# Patient Record
Sex: Female | Born: 1960 | Race: Black or African American | Hispanic: No | Marital: Single | Smoking: Never smoker
Health system: Southern US, Community
[De-identification: ages and names within clinical notes are randomized; demographics above are authoritative.]

## PROBLEM LIST (undated history)

## (undated) DIAGNOSIS — O3410 Maternal care for benign tumor of corpus uteri, unspecified trimester: Secondary | ICD-10-CM

## (undated) DIAGNOSIS — I10 Essential (primary) hypertension: Secondary | ICD-10-CM

## (undated) DIAGNOSIS — Z9289 Personal history of other medical treatment: Secondary | ICD-10-CM

## (undated) DIAGNOSIS — J45909 Unspecified asthma, uncomplicated: Secondary | ICD-10-CM

## (undated) DIAGNOSIS — Z332 Encounter for elective termination of pregnancy: Secondary | ICD-10-CM

## (undated) DIAGNOSIS — D259 Leiomyoma of uterus, unspecified: Secondary | ICD-10-CM

## (undated) DIAGNOSIS — R011 Cardiac murmur, unspecified: Secondary | ICD-10-CM

## (undated) HISTORY — PX: WISDOM TOOTH EXTRACTION: SHX21

---

## 2015-10-25 ENCOUNTER — Encounter: Payer: Self-pay | Admitting: Family Medicine

## 2015-10-31 ENCOUNTER — Encounter: Payer: Self-pay | Admitting: Family Medicine

## 2015-11-12 ENCOUNTER — Encounter: Payer: Self-pay | Admitting: Family Medicine

## 2015-12-17 ENCOUNTER — Encounter: Payer: Self-pay | Admitting: Family Medicine

## 2016-01-08 ENCOUNTER — Encounter: Payer: Self-pay | Admitting: Family Medicine

## 2016-06-14 ENCOUNTER — Emergency Department (HOSPITAL_COMMUNITY): Payer: Self-pay

## 2016-06-14 ENCOUNTER — Encounter (HOSPITAL_COMMUNITY): Payer: Self-pay | Admitting: Emergency Medicine

## 2016-06-14 ENCOUNTER — Inpatient Hospital Stay (HOSPITAL_COMMUNITY)
Admission: EM | Admit: 2016-06-14 | Discharge: 2016-06-16 | DRG: 418 | Disposition: A | Discharge status: Home / Self Care | Payer: Self-pay | Attending: Surgery | Admitting: Surgery

## 2016-06-14 DIAGNOSIS — Z888 Allergy status to other drugs, medicaments and biological substances status: Secondary | ICD-10-CM

## 2016-06-14 DIAGNOSIS — K819 Cholecystitis, unspecified: Secondary | ICD-10-CM

## 2016-06-14 DIAGNOSIS — N1330 Unspecified hydronephrosis: Secondary | ICD-10-CM | POA: Diagnosis present

## 2016-06-14 DIAGNOSIS — Z6841 Body Mass Index (BMI) 40.0 and over, adult: Secondary | ICD-10-CM

## 2016-06-14 DIAGNOSIS — K8010 Calculus of gallbladder with chronic cholecystitis without obstruction: Principal | ICD-10-CM | POA: Diagnosis present

## 2016-06-14 DIAGNOSIS — R918 Other nonspecific abnormal finding of lung field: Secondary | ICD-10-CM | POA: Diagnosis present

## 2016-06-14 DIAGNOSIS — R1011 Right upper quadrant pain: Secondary | ICD-10-CM

## 2016-06-14 DIAGNOSIS — K760 Fatty (change of) liver, not elsewhere classified: Secondary | ICD-10-CM | POA: Diagnosis present

## 2016-06-14 DIAGNOSIS — J45909 Unspecified asthma, uncomplicated: Secondary | ICD-10-CM | POA: Diagnosis present

## 2016-06-14 DIAGNOSIS — D1771 Benign lipomatous neoplasm of kidney: Secondary | ICD-10-CM | POA: Diagnosis present

## 2016-06-14 DIAGNOSIS — I10 Essential (primary) hypertension: Secondary | ICD-10-CM | POA: Diagnosis present

## 2016-06-14 DIAGNOSIS — D3502 Benign neoplasm of left adrenal gland: Secondary | ICD-10-CM | POA: Diagnosis present

## 2016-06-14 HISTORY — DX: Unspecified asthma, uncomplicated: J45.909

## 2016-06-14 HISTORY — DX: Essential (primary) hypertension: I10

## 2016-06-14 HISTORY — DX: Cardiac murmur, unspecified: R01.1

## 2016-06-14 HISTORY — DX: Leiomyoma of uterus, unspecified: D25.9

## 2016-06-14 HISTORY — DX: Maternal care for benign tumor of corpus uteri, unspecified trimester: O34.10

## 2016-06-14 LAB — SURGICAL PCR SCREEN
MRSA, PCR: NEGATIVE
Staphylococcus aureus: NEGATIVE

## 2016-06-14 LAB — COMPREHENSIVE METABOLIC PANEL
ALBUMIN: 3.2 g/dL — AB (ref 3.5–5.0)
ALK PHOS: 67 U/L (ref 38–126)
ALT: 182 U/L — AB (ref 14–54)
AST: 78 U/L — AB (ref 15–41)
Anion gap: 12 (ref 5–15)
BILIRUBIN TOTAL: 0.7 mg/dL (ref 0.3–1.2)
BUN: 6 mg/dL (ref 6–20)
CALCIUM: 9.2 mg/dL (ref 8.9–10.3)
CO2: 21 mmol/L — ABNORMAL LOW (ref 22–32)
CREATININE: 0.62 mg/dL (ref 0.44–1.00)
Chloride: 104 mmol/L (ref 101–111)
GFR calc Af Amer: >60 mL/min (ref >60)
GLUCOSE: 112 mg/dL — AB (ref 65–99)
POTASSIUM: 4 mmol/L (ref 3.5–5.1)
Sodium: 137 mmol/L (ref 135–145)
TOTAL PROTEIN: 7.5 g/dL (ref 6.5–8.1)

## 2016-06-14 LAB — CBC WITH DIFFERENTIAL/PLATELET
BASOS ABS: 0 10*3/uL (ref 0.0–0.1)
Basophils Relative: 0 %
Eosinophils Absolute: 0 10*3/uL (ref 0.0–0.7)
Eosinophils Relative: 0 %
HEMATOCRIT: 43.6 % (ref 36.0–46.0)
HEMOGLOBIN: 14.7 g/dL (ref 12.0–15.0)
LYMPHS PCT: 15 %
Lymphs Abs: 1.2 10*3/uL (ref 0.7–4.0)
MCH: 32 pg (ref 26.0–34.0)
MCHC: 33.7 g/dL (ref 30.0–36.0)
MCV: 95 fL (ref 78.0–100.0)
MONO ABS: 0.7 10*3/uL (ref 0.1–1.0)
Monocytes Relative: 8 %
NEUTROS ABS: 6.2 10*3/uL (ref 1.7–7.7)
NEUTROS PCT: 77 %
Platelets: 367 10*3/uL (ref 150–400)
RBC: 4.59 MIL/uL (ref 3.87–5.11)
RDW: 14.1 % (ref 11.5–15.5)
WBC: 8.1 10*3/uL (ref 4.0–10.5)

## 2016-06-14 LAB — URINALYSIS, ROUTINE W REFLEX MICROSCOPIC
GLUCOSE, UA: 100 mg/dL — AB
KETONES UR: 15 mg/dL — AB
Leukocytes, UA: NEGATIVE
Nitrite: NEGATIVE
PH: 6.5 (ref 5.0–8.0)
Protein, ur: 100 mg/dL — AB

## 2016-06-14 LAB — URINE MICROSCOPIC-ADD ON

## 2016-06-14 LAB — LIPASE, BLOOD: LIPASE: 23 U/L (ref 11–51)

## 2016-06-14 LAB — TROPONIN I: Troponin I: <0.03 ng/mL (ref <0.03)

## 2016-06-14 MED ORDER — ONDANSETRON HCL 4 MG/2ML IJ SOLN: 4.0000 mg | Freq: Four times a day (QID) | INTRAMUSCULAR | Status: DC | PRN

## 2016-06-14 MED ORDER — OXYCODONE HCL 5 MG PO TABS
5.0000 mg | ORAL_TABLET | ORAL | Status: DC | PRN
Start: 2016-06-14 — End: 2016-06-16
  Administered 2016-06-16 (×3): 10 mg via ORAL
  Filled 2016-06-14 (×3): qty 2

## 2016-06-14 MED ORDER — PROMETHAZINE HCL 25 MG/ML IJ SOLN
12.5000 mg | Freq: Once | INTRAMUSCULAR | Status: AC
  Administered 2016-06-14: 12.5 mg via INTRAVENOUS
  Filled 2016-06-14: qty 1

## 2016-06-14 MED ORDER — ACETAMINOPHEN 650 MG RE SUPP: 650.0000 mg | Freq: Four times a day (QID) | RECTAL | Status: DC | PRN

## 2016-06-14 MED ORDER — DEXTROSE 5 % IV SOLN
2.0000 g | INTRAVENOUS | Status: DC
  Administered 2016-06-15 – 2016-06-16 (×2): 2 g via INTRAVENOUS
  Filled 2016-06-14 (×2): qty 2

## 2016-06-14 MED ORDER — FAMOTIDINE IN NACL 20-0.9 MG/50ML-% IV SOLN
20.0000 mg | Freq: Two times a day (BID) | INTRAVENOUS | Status: DC
  Administered 2016-06-14 – 2016-06-16 (×5): 20 mg via INTRAVENOUS
  Filled 2016-06-14 (×6): qty 50

## 2016-06-14 MED ORDER — ZOLPIDEM TARTRATE 5 MG PO TABS: 5.0000 mg | ORAL_TABLET | Freq: Every evening | ORAL | Status: DC | PRN

## 2016-06-14 MED ORDER — FENTANYL CITRATE (PF) 100 MCG/2ML IJ SOLN
50.0000 ug | Freq: Once | INTRAMUSCULAR | Status: AC
  Administered 2016-06-14: 50 ug via INTRAVENOUS
  Filled 2016-06-14: qty 2

## 2016-06-14 MED ORDER — KCL IN DEXTROSE-NACL 20-5-0.45 MEQ/L-%-% IV SOLN
INTRAVENOUS | Status: DC
  Administered 2016-06-14 – 2016-06-16 (×4): via INTRAVENOUS
  Filled 2016-06-14 (×6): qty 1000

## 2016-06-14 MED ORDER — PROMETHAZINE HCL 25 MG/ML IJ SOLN
12.5000 mg | Freq: Four times a day (QID) | INTRAMUSCULAR | Status: DC | PRN
  Administered 2016-06-14: 12.5 mg via INTRAVENOUS
  Filled 2016-06-14 (×2): qty 1

## 2016-06-14 MED ORDER — ONDANSETRON HCL 4 MG/2ML IJ SOLN
4.0000 mg | Freq: Once | INTRAMUSCULAR | Status: AC
  Administered 2016-06-14: 4 mg via INTRAVENOUS
  Filled 2016-06-14: qty 2

## 2016-06-14 MED ORDER — HYDROMORPHONE HCL 1 MG/ML IJ SOLN
0.5000 mg | INTRAMUSCULAR | Status: DC | PRN
  Administered 2016-06-14: 2 mg via INTRAVENOUS
  Administered 2016-06-14 (×2): 1 mg via INTRAVENOUS
  Administered 2016-06-15 (×3): 2 mg via INTRAVENOUS
  Administered 2016-06-15: 1 mg via INTRAVENOUS
  Administered 2016-06-15: 2 mg via INTRAVENOUS
  Filled 2016-06-14: qty 2
  Filled 2016-06-14: qty 1
  Filled 2016-06-14 (×3): qty 2
  Filled 2016-06-14 (×2): qty 1
  Filled 2016-06-14: qty 2

## 2016-06-14 MED ORDER — ONDANSETRON 4 MG PO TBDP: 4.0000 mg | ORAL_TABLET | Freq: Four times a day (QID) | ORAL | Status: DC | PRN

## 2016-06-14 MED ORDER — ACETAMINOPHEN 325 MG PO TABS: 650.0000 mg | ORAL_TABLET | Freq: Four times a day (QID) | ORAL | Status: DC | PRN

## 2016-06-14 MED ORDER — ALBUTEROL SULFATE (2.5 MG/3ML) 0.083% IN NEBU: 2.5000 mg | INHALATION_SOLUTION | RESPIRATORY_TRACT | Status: DC | PRN

## 2016-06-14 MED ORDER — HYDROCHLOROTHIAZIDE 25 MG PO TABS
12.5000 mg | ORAL_TABLET | Freq: Every day | ORAL | Status: DC
  Administered 2016-06-14 – 2016-06-16 (×2): 12.5 mg via ORAL
  Filled 2016-06-14: qty 1

## 2016-06-14 MED ORDER — NORETHINDRONE ACETATE 5 MG PO TABS
10.0000 mg | ORAL_TABLET | Freq: Every day | ORAL | Status: DC
  Administered 2016-06-14: 10 mg via ORAL

## 2016-06-14 MED ORDER — ENOXAPARIN SODIUM 40 MG/0.4ML ~~LOC~~ SOLN: 40.0000 mg | Freq: Once | SUBCUTANEOUS | Status: DC

## 2016-06-14 MED ORDER — HYDROMORPHONE HCL 1 MG/ML IJ SOLN
1.0000 mg | INTRAMUSCULAR | Status: DC | PRN
  Administered 2016-06-14: 1 mg via INTRAVENOUS
  Filled 2016-06-14: qty 1

## 2016-06-14 MED ORDER — HYDRALAZINE HCL 20 MG/ML IJ SOLN: 10.0000 mg | INTRAMUSCULAR | Status: DC | PRN

## 2016-06-14 MED ORDER — DEXTROSE 5 % IV SOLN
2.0000 g | Freq: Once | INTRAVENOUS | Status: AC
  Administered 2016-06-14: 2 g via INTRAVENOUS
  Filled 2016-06-14: qty 2

## 2016-06-14 MED ORDER — ONDANSETRON HCL 4 MG/2ML IJ SOLN
2.0000 mg | Freq: Four times a day (QID) | INTRAMUSCULAR | Status: DC | PRN
  Administered 2016-06-14: 2 mg via INTRAVENOUS
  Filled 2016-06-14: qty 2

## 2016-06-14 MED ORDER — METHOCARBAMOL 500 MG PO TABS
500.0000 mg | ORAL_TABLET | Freq: Four times a day (QID) | ORAL | Status: DC | PRN
  Administered 2016-06-16: 500 mg via ORAL
  Filled 2016-06-14: qty 1

## 2016-06-14 MED ORDER — SIMETHICONE 80 MG PO CHEW
40.0000 mg | CHEWABLE_TABLET | Freq: Four times a day (QID) | ORAL | Status: DC | PRN
  Administered 2016-06-14: 40 mg via ORAL
  Filled 2016-06-14: qty 1

## 2016-06-14 NOTE — ED Notes (Signed)
Pt and family made aware of bed assignment 

## 2016-06-14 NOTE — ED Notes (Signed)
Patient transported to X-ray 

## 2016-06-14 NOTE — ED Triage Notes (Signed)
Pt arrives from home c/o epigastric pain and CP that started as pain behind R shoulder blade.  Pt states pain woke her up, reports nausea, denies LOC, vomiting, diarrhea, SOB.

## 2016-06-14 NOTE — ED Notes (Signed)
Patient transported to CT 
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
## 2016-06-14 NOTE — ED Notes (Signed)
Attempted report to 6N 

## 2016-06-14 NOTE — ED Notes (Signed)
Pt is actively vomiting Dr. Wyvonnia Dusky informed and this RN given verbal order for phenergan and fentanyl.

## 2016-06-14 NOTE — ED Notes (Signed)
MD at bedside. 

## 2016-06-14 NOTE — ED Provider Notes (Signed)
[facility] DEPT Provider Note   CSN: [id number] Arrival date & time: 06/14/16  [id number]     History   Chief Complaint Chief Complaint  Patient presents with  . Abdominal Pain  . Chest Pain    HPI Laurie Peters is a 55 y.o. female.  Patient presents with epigastric and right upper quadrant pain that started this morning around 2 AM. Pain woke her from sleep actually that was in her posterior right shoulder has since moved to her upper abdomen and radiates into her chest. Associated with nausea and dry heaving but no vomiting. She has not had this pain in the past. No fever. She said chills. Denies any cardiac history. Denies any dysuria hematuria. Denies any vaginal bleeding or discharge. She still has a gallbladder. She has no change in bowel habits.   The history is provided by the patient. The history is limited by the condition of the patient.  Abdominal Pain   Associated symptoms include nausea. Pertinent negatives include fever, vomiting, dysuria, hematuria, headaches, arthralgias and myalgias.  Chest Pain   Associated symptoms include abdominal pain and nausea. Pertinent negatives include no cough, no dizziness, no fever, no headaches, no shortness of breath and no vomiting.    Past Medical History:  Diagnosis Date  . Asthma   . Heart murmur   . Hypertension   . Uterine fibroids affecting pregnancy, antepartum     There are no active problems to display for this patient.   History reviewed. No pertinent surgical history.  OB History    No data available       Home Medications    Prior to Admission medications   Not on File    Family History No family history on file.  Social History Social History  Substance Use Topics  . Smoking status: Never Smoker  . Smokeless tobacco: Never Used  . Alcohol use Yes     Comment: "occassionally"     Allergies   Theraflu cold & [phenylephrine-pheniramine-dm]   Review of Systems Review of Systems    Constitutional: Positive for activity change, appetite change and chills. Negative for fever.  HENT: Negative for congestion and rhinorrhea.   Eyes: Negative for visual disturbance.  Respiratory: Positive for chest tightness. Negative for cough and shortness of breath.   Cardiovascular: Positive for chest pain. Negative for leg swelling.  Gastrointestinal: Positive for abdominal pain and nausea. Negative for vomiting.  Genitourinary: Negative for dysuria, hematuria, vaginal bleeding and vaginal discharge.  Musculoskeletal: Negative for arthralgias and myalgias.  Skin: Negative for wound.  Neurological: Negative for dizziness, light-headedness and headaches.  A complete 10 system review of systems was obtained and all systems are negative except as noted in the HPI and PMH.     Physical Exam Updated Vital Signs BP 151/82 (BP Location: Right Arm)   Pulse 72   Temp 97.7 F (36.5 C) (Oral)   Resp 17   SpO2 100%   Physical Exam  Constitutional: She is oriented to person, place, and time. She appears well-developed and well-nourished. She appears distressed.  uncomfortable  HENT:  Head: Normocephalic and atraumatic.  Mouth/Throat: Oropharynx is clear and moist. No oropharyngeal exudate.  Eyes: Conjunctivae and EOM are normal. Pupils are equal, round, and reactive to light.  Neck: Normal range of motion. Neck supple.  No meningismus.  Cardiovascular: Normal rate, regular rhythm, normal heart sounds and intact distal pulses.   No murmur heard. Pulmonary/Chest: Effort normal and breath sounds normal. No respiratory distress. She exhibits  no tenderness.  Abdominal: Soft. There is tenderness. There is guarding. There is no rebound.  TTP epigastric and RUQ with guarding  Musculoskeletal: Normal range of motion. She exhibits no edema or tenderness.  Neurological: She is alert and oriented to person, place, and time. No cranial nerve deficit. She exhibits normal muscle tone. Coordination  normal.   5/5 strength throughout. CN 2-12 intact.Equal grip strength.   Skin: Skin is warm.  Psychiatric: She has a normal mood and affect. Her behavior is normal.  Nursing note and vitals reviewed.    ED Treatments / Results  Labs (all labs ordered are listed, but only abnormal results are displayed) Labs Reviewed  COMPREHENSIVE METABOLIC PANEL - Abnormal; Notable for the following:       Result Value   CO2 21 (*)    Glucose, Bld 112 (*)    Albumin 3.2 (*)    AST 78 (*)    ALT 182 (*)    All other components within normal limits  URINALYSIS, ROUTINE W REFLEX MICROSCOPIC (NOT AT [facility]) - Abnormal; Notable for the following:    Specific Gravity, Urine >1.030 (*)    Glucose, UA 100 (*)    Hgb urine dipstick MODERATE (*)    Bilirubin Urine SMALL (*)    Ketones, ur 15 (*)    Protein, ur 100 (*)    All other components within normal limits  URINE MICROSCOPIC-ADD ON - Abnormal; Notable for the following:    Squamous Epithelial / LPF 0-5 (*)    Bacteria, UA FEW (*)    All other components within normal limits  SURGICAL PCR SCREEN  CBC WITH DIFFERENTIAL/PLATELET  LIPASE, BLOOD  TROPONIN I  COMPREHENSIVE METABOLIC PANEL  CBC    EKG  EKG Interpretation  Date/Time:  Saturday June 14 2016 07:06:42 EDT Ventricular Rate:  62 PR Interval:    QRS Duration: 95 QT Interval:  388 QTC Calculation: 394 R Axis:   32 Text Interpretation:  Sinus rhythm ST elev, probable normal early repol pattern No previous ECGs available Confirmed by Wyvonnia Dusky  MD, Calhoun Reichardt [phone]) on 06/14/2016 7:23:24 AM       Radiology Dg Chest 2 View  Result Date: 06/14/2016 CLINICAL DATA:  Pt c/o upper abdominal pain, lower back pain, n/v, with fever since 2 am. Pt rates pain an 8 on a 1-10 scale. Hx asthma, HTN, non-smoker. EXAM: CHEST  2 VIEW COMPARISON:  None. FINDINGS: The heart size and mediastinal contours are within normal limits. Both lungs are clear. No pleural effusion or pneumothorax. The  visualized skeletal structures are unremarkable. IMPRESSION: No active cardiopulmonary disease. Electronically Signed   By: Lajean Manes M.D.   On: 06/14/2016 09:16   Ct Renal Stone Study  Addendum Date: 06/14/2016   ADDENDUM REPORT: 06/14/2016 11:46 ADDENDUM: The right middle lobe lung nodules are not visible on the chest radiographs obtained earlier today. Electronically Signed   By: Claudie Revering M.D.   On: 06/14/2016 11:46   Result Date: 06/14/2016 CLINICAL DATA:  Right the upper quadrant abdominal pain, nausea and vomiting since 2 a.m. today. Right hydronephrosis at ultrasound earlier today. EXAM: CT ABDOMEN AND PELVIS WITHOUT CONTRAST TECHNIQUE: Multidetector CT imaging of the abdomen and pelvis was performed following the standard protocol without IV contrast. COMPARISON:  Right upper quadrant abdomen ultrasound obtained earlier today. FINDINGS: Lower chest: Minimal bilateral dependent atelectasis. 8 x 4 mm oval nodular density in the lateral aspect of the right middle lobe on the first image. Two adjacent 5  mm nodular densities in the lateral aspect of the right lower lobe on image 5. Minimal bilateral lower lobe dependent atelectasis. Hepatobiliary: Multiple gallstones in the gallbladder. No gallbladder wall thickening or pericholecystic fluid. Normal appearing liver. Pancreas: Unremarkable. No pancreatic ductal dilatation or surrounding inflammatory changes. Spleen: Normal in size without focal abnormality. Adrenals/Urinary Tract: Two low-density left adrenal masses. One measures 1.7 x 1.5 cm and -4 Hounsfield units on image number 24 and the other measures 2.3 x 1.2 cm and -13 Hounsfield units on image number 20 of series 2. Normal appearing right adrenal gland. Rounded, predominantly fat density mass in the anterior aspect of the upper pole of the right kidney, measuring 2.2 x 1.8 cm on image number 28 of series 2. Multiple bilateral parapelvic renal cysts. No hydronephrosis seen. Normal appearing  ureters. Poorly distended urinary bladder with no visible abnormality. The Stomach/Bowel: Stomach is within normal limits. Appendix appears normal. No evidence of bowel wall thickening, distention, or inflammatory changes. Vascular/Lymphatic: No significant vascular findings are present. No enlarged abdominal or pelvic lymph nodes. Reproductive: Enlarged uterus containing multiple poorly defined masses. Known uterine fibroids by history. No adnexal masses. Other: No free peritoneal fluid. Musculoskeletal: Mild bilateral hip degenerative changes. Mild lumbar and lower thoracic spine degenerative changes. IMPRESSION: 1. No acute abnormality. 2. Cholelithiasis. 3. 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 5. Bilateral renal parapelvic cysts without hydronephrosis. 6. Three right middle lobe nodules. The largest has an average diameter of 6 mm and is not included in its entirety. Non-contrast chest CT at 6-12 months is recommended. If the nodule is stable at time of repeat CT, then future CT at 18-24 months (from today's scan) is considered optional for low-risk patients, but is recommended for high-risk patients. This recommendation follows the consensus statement: Guidelines for Management of Incidental Pulmonary Nodules Detected on CT Images: From the Fleischner Society 2017; Radiology 2017; 284:228-243. Electronically Signed: By: Claudie Revering M.D. On: 06/14/2016 11:42   US Abdomen Limited Ruq  Result Date: 06/14/2016 CLINICAL DATA:  Right upper quadrant pain since 20 a.m. EXAM: US ABDOMEN LIMITED - RIGHT UPPER QUADRANT COMPARISON:  None. FINDINGS: Gallbladder: Dependent sludge and small stones, largest measuring 9 mm. No wall thickening. No pericholecystic fluid. Common bile duct: Diameter: 5 mm Liver: Mild increased parenchymal echogenicity suggesting fatty infiltration. No mass or focal lesion. Normal liver size. Normal hepatopetal flow in the portal vein. There is mild to moderate right  hydronephrosis. IMPRESSION: 1. No evidence of acute cholecystitis. 2. Small gallstones and gallbladder sludge. 3. Mild increased echogenicity of the liver suggesting mild hepatic steatosis. 4. Right hydronephrosis. Patient may have obstructive uropathy on the right. Consider follow-up unenhanced CT of the abdomen pelvis for further assessment. Electronically Signed   By: Lajean Manes M.D.   On: 06/14/2016 09:18    Procedures Procedures (including critical care time)  Medications Ordered in ED Medications  ondansetron (ZOFRAN) injection 4 mg (not administered)  fentaNYL (SUBLIMAZE) injection 50 mcg (not administered)     Initial Impression / Assessment and Plan / ED Course  I have reviewed the triage vital signs and the nursing notes.  Pertinent labs & imaging results that were available during my care of the patient were reviewed by me and considered in my medical decision making (see chart for details).  Clinical Course  Epigastric and right upper quadrant pain A shoulder associated with nausea. EKG shows J-point elevation consistent with early repolarization. No reciprocal change, no comparison.  Presentation appears consistent with  gallbladder pathology. Doubt ACS. LFTs mildly elevated.  Lipase normal, troponin normal.  US shows cholelithiasis. CT does not confirm hydronephrosis.  Patient informed of lung nodules.  D/w Surgery who will admit for symptomatic cholelithiasis.  Rocephin given.  Final Clinical Impressions(s) / ED Diagnoses   Final diagnoses:  RUQ pain  Cholecystitis    New Prescriptions New Prescriptions   No medications on file     Ezequiel Essex, MD 06/14/16 1752

## 2016-06-15 ENCOUNTER — Encounter (HOSPITAL_COMMUNITY): Admission: EM | Disposition: A | Discharge status: Home / Self Care | Payer: Self-pay | Source: Home / Self Care

## 2016-06-15 ENCOUNTER — Inpatient Hospital Stay (HOSPITAL_COMMUNITY): Payer: Self-pay | Admitting: Anesthesiology

## 2016-06-15 ENCOUNTER — Inpatient Hospital Stay (HOSPITAL_COMMUNITY): Payer: Self-pay

## 2016-06-15 HISTORY — PX: CHOLECYSTECTOMY: SHX55

## 2016-06-15 LAB — COMPREHENSIVE METABOLIC PANEL
ALT: 232 U/L — ABNORMAL HIGH (ref 14–54)
ANION GAP: 5 (ref 5–15)
AST: 128 U/L — ABNORMAL HIGH (ref 15–41)
Albumin: 2.9 g/dL — ABNORMAL LOW (ref 3.5–5.0)
Alkaline Phosphatase: 55 U/L (ref 38–126)
BUN: <5 mg/dL — ABNORMAL LOW (ref 6–20)
CALCIUM: 8.3 mg/dL — AB (ref 8.9–10.3)
CHLORIDE: 105 mmol/L (ref 101–111)
CO2: 24 mmol/L (ref 22–32)
Creatinine, Ser: 0.71 mg/dL (ref 0.44–1.00)
GFR calc non Af Amer: >60 mL/min (ref >60)
Glucose, Bld: 117 mg/dL — ABNORMAL HIGH (ref 65–99)
POTASSIUM: 3.7 mmol/L (ref 3.5–5.1)
SODIUM: 134 mmol/L — AB (ref 135–145)
Total Bilirubin: 0.8 mg/dL (ref 0.3–1.2)
Total Protein: 6.5 g/dL (ref 6.5–8.1)

## 2016-06-15 LAB — CBC
HEMATOCRIT: 43.3 % (ref 36.0–46.0)
HEMOGLOBIN: 14 g/dL (ref 12.0–15.0)
MCH: 31 pg (ref 26.0–34.0)
MCHC: 32.3 g/dL (ref 30.0–36.0)
MCV: 95.8 fL (ref 78.0–100.0)
Platelets: 392 10*3/uL (ref 150–400)
RBC: 4.52 MIL/uL (ref 3.87–5.11)
RDW: 14.3 % (ref 11.5–15.5)
WBC: 12.5 10*3/uL — ABNORMAL HIGH (ref 4.0–10.5)

## 2016-06-15 SURGERY — LAPAROSCOPIC CHOLECYSTECTOMY
Anesthesia: General | Site: Abdomen

## 2016-06-15 MED ORDER — ROCURONIUM BROMIDE 10 MG/ML (PF) SYRINGE
PREFILLED_SYRINGE | INTRAVENOUS | Status: AC
  Filled 2016-06-15: qty 10

## 2016-06-15 MED ORDER — IOPAMIDOL (ISOVUE-300) INJECTION 61%
INTRAVENOUS | Status: AC
  Filled 2016-06-15: qty 50

## 2016-06-15 MED ORDER — ROCURONIUM BROMIDE 10 MG/ML (PF) SYRINGE
PREFILLED_SYRINGE | INTRAVENOUS | Status: DC | PRN
  Administered 2016-06-15 (×2): 20 mg via INTRAVENOUS

## 2016-06-15 MED ORDER — ONDANSETRON HCL 4 MG/2ML IJ SOLN
INTRAMUSCULAR | Status: DC | PRN
  Administered 2016-06-15: 4 mg via INTRAVENOUS

## 2016-06-15 MED ORDER — ONDANSETRON HCL 4 MG/2ML IJ SOLN
INTRAMUSCULAR | Status: AC
  Filled 2016-06-15: qty 2

## 2016-06-15 MED ORDER — LIDOCAINE 2% (20 MG/ML) 5 ML SYRINGE
INTRAMUSCULAR | Status: AC
  Filled 2016-06-15: qty 5

## 2016-06-15 MED ORDER — HYDROMORPHONE HCL 1 MG/ML IJ SOLN
INTRAMUSCULAR | Status: AC
  Administered 2016-06-15: 0.5 mg via INTRAVENOUS
  Filled 2016-06-15: qty 1

## 2016-06-15 MED ORDER — SODIUM CHLORIDE 0.9 % IV SOLN
INTRAVENOUS | Status: DC | PRN
  Administered 2016-06-15: 11:00:00 via INTRAVENOUS

## 2016-06-15 MED ORDER — KETOROLAC TROMETHAMINE 30 MG/ML IJ SOLN
INTRAMUSCULAR | Status: AC
  Filled 2016-06-15: qty 1

## 2016-06-15 MED ORDER — FENTANYL CITRATE (PF) 100 MCG/2ML IJ SOLN
INTRAMUSCULAR | Status: DC | PRN
  Administered 2016-06-15 (×2): 100 ug via INTRAVENOUS

## 2016-06-15 MED ORDER — NORETHINDRONE ACETATE 5 MG PO TABS
10.0000 mg | ORAL_TABLET | ORAL | Status: DC
  Administered 2016-06-16: 10 mg via ORAL
  Filled 2016-06-15: qty 2

## 2016-06-15 MED ORDER — LIDOCAINE 2% (20 MG/ML) 5 ML SYRINGE
INTRAMUSCULAR | Status: DC | PRN
  Administered 2016-06-15: 50 mg via INTRAVENOUS

## 2016-06-15 MED ORDER — SODIUM CHLORIDE 0.9 % IV SOLN: INTRAVENOUS | Status: DC | PRN

## 2016-06-15 MED ORDER — BUPIVACAINE-EPINEPHRINE 0.25% -1:200000 IJ SOLN
INTRAMUSCULAR | Status: DC | PRN
  Administered 2016-06-15: 20 mL

## 2016-06-15 MED ORDER — FENTANYL CITRATE (PF) 100 MCG/2ML IJ SOLN
INTRAMUSCULAR | Status: AC
  Filled 2016-06-15: qty 2

## 2016-06-15 MED ORDER — HYDROCODONE-ACETAMINOPHEN 7.5-325 MG PO TABS: 1.0000 | ORAL_TABLET | Freq: Once | ORAL | Status: DC | PRN

## 2016-06-15 MED ORDER — 0.9 % SODIUM CHLORIDE (POUR BTL) OPTIME
TOPICAL | Status: DC | PRN
  Administered 2016-06-15: 1000 mL

## 2016-06-15 MED ORDER — PROPOFOL 10 MG/ML IV BOLUS
INTRAVENOUS | Status: AC
  Filled 2016-06-15: qty 20

## 2016-06-15 MED ORDER — MIDAZOLAM HCL 2 MG/2ML IJ SOLN
INTRAMUSCULAR | Status: AC
  Filled 2016-06-15: qty 2

## 2016-06-15 MED ORDER — SUCCINYLCHOLINE CHLORIDE 200 MG/10ML IV SOSY
PREFILLED_SYRINGE | INTRAVENOUS | Status: DC | PRN
  Administered 2016-06-15: 120 mg via INTRAVENOUS

## 2016-06-15 MED ORDER — FENTANYL CITRATE (PF) 100 MCG/2ML IJ SOLN
INTRAMUSCULAR | Status: AC
Start: 2016-06-15 — End: 2016-06-15
  Filled 2016-06-15: qty 2

## 2016-06-15 MED ORDER — PROPOFOL 10 MG/ML IV BOLUS
INTRAVENOUS | Status: DC | PRN
  Administered 2016-06-15: 200 mg via INTRAVENOUS

## 2016-06-15 MED ORDER — SUGAMMADEX SODIUM 500 MG/5ML IV SOLN
INTRAVENOUS | Status: AC
  Filled 2016-06-15: qty 5

## 2016-06-15 MED ORDER — SUCCINYLCHOLINE CHLORIDE 200 MG/10ML IV SOSY
PREFILLED_SYRINGE | INTRAVENOUS | Status: AC
  Filled 2016-06-15: qty 10

## 2016-06-15 MED ORDER — BUPIVACAINE-EPINEPHRINE (PF) 0.25% -1:200000 IJ SOLN
INTRAMUSCULAR | Status: AC
  Filled 2016-06-15: qty 30

## 2016-06-15 MED ORDER — HYDROMORPHONE HCL 1 MG/ML IJ SOLN
0.2500 mg | INTRAMUSCULAR | Status: DC | PRN
  Administered 2016-06-15 (×4): 0.5 mg via INTRAVENOUS

## 2016-06-15 MED ORDER — DEXAMETHASONE SODIUM PHOSPHATE 10 MG/ML IJ SOLN
INTRAMUSCULAR | Status: DC | PRN
Start: 2016-06-15 — End: 2016-06-15
  Administered 2016-06-15: 10 mg via INTRAVENOUS

## 2016-06-15 MED ORDER — KETOROLAC TROMETHAMINE 30 MG/ML IJ SOLN
30.0000 mg | Freq: Once | INTRAMUSCULAR | Status: AC | PRN
Start: 2016-06-15 — End: 2016-06-15
  Administered 2016-06-15: 30 mg via INTRAVENOUS

## 2016-06-15 MED ORDER — PROMETHAZINE HCL 25 MG/ML IJ SOLN: 6.2500 mg | INTRAMUSCULAR | Status: DC | PRN

## 2016-06-15 MED ORDER — SODIUM CHLORIDE 0.9 % IR SOLN
Status: DC | PRN
  Administered 2016-06-15 (×2): 1000 mL

## 2016-06-15 MED ORDER — DEXAMETHASONE SODIUM PHOSPHATE 10 MG/ML IJ SOLN
INTRAMUSCULAR | Status: AC
  Filled 2016-06-15: qty 1

## 2016-06-15 MED ORDER — SUGAMMADEX SODIUM 500 MG/5ML IV SOLN
INTRAVENOUS | Status: DC | PRN
  Administered 2016-06-15: 500 mg via INTRAVENOUS

## 2016-06-15 SURGICAL SUPPLY — 38 items
APPLIER CLIP 5 13 M/L LIGAMAX5 (MISCELLANEOUS) ×4
BLADE SURG CLIPPER 3M 9600 (MISCELLANEOUS) IMPLANT
CANISTER SUCTION 2500CC (MISCELLANEOUS) ×4 IMPLANT
CHLORAPREP W/TINT 26ML (MISCELLANEOUS) ×4 IMPLANT
CLIP APPLIE 5 13 M/L LIGAMAX5 (MISCELLANEOUS) ×2 IMPLANT
COVER MAYO STAND STRL (DRAPES) ×4 IMPLANT
COVER SURGICAL LIGHT HANDLE (MISCELLANEOUS) ×4 IMPLANT
DRAPE C-ARM 42X72 X-RAY (DRAPES) ×4 IMPLANT
ELECT REM PT RETURN 9FT ADLT (ELECTROSURGICAL) ×4
ELECTRODE REM PT RTRN 9FT ADLT (ELECTROSURGICAL) ×2 IMPLANT
GLOVE BIO SURGEON STRL SZ8 (GLOVE) ×4 IMPLANT
GLOVE BIOGEL PI IND STRL 8 (GLOVE) ×2 IMPLANT
GLOVE BIOGEL PI INDICATOR 8 (GLOVE) ×2
GLOVE ECLIPSE 8.0 STRL XLNG CF (GLOVE) ×4 IMPLANT
GLOVE SURG SIGNA 7.5 PF LTX (GLOVE) ×4 IMPLANT
GOWN STRL REUS W/ TWL LRG LVL3 (GOWN DISPOSABLE) ×4 IMPLANT
GOWN STRL REUS W/ TWL XL LVL3 (GOWN DISPOSABLE) ×4 IMPLANT
GOWN STRL REUS W/TWL LRG LVL3 (GOWN DISPOSABLE) ×4
GOWN STRL REUS W/TWL XL LVL3 (GOWN DISPOSABLE) ×4
HEMOSTAT SNOW SURGICEL 2X4 (HEMOSTASIS) ×4 IMPLANT
KIT BASIN OR (CUSTOM PROCEDURE TRAY) ×4 IMPLANT
KIT ROOM TURNOVER OR (KITS) ×4 IMPLANT
LIQUID BAND (GAUZE/BANDAGES/DRESSINGS) ×4 IMPLANT
NS IRRIG 1000ML POUR BTL (IV SOLUTION) ×4 IMPLANT
PAD ARMBOARD 7.5X6 YLW CONV (MISCELLANEOUS) ×4 IMPLANT
POUCH SPECIMEN RETRIEVAL 10MM (ENDOMECHANICALS) ×4 IMPLANT
SCISSORS LAP 5X35 DISP (ENDOMECHANICALS) ×4 IMPLANT
SET CHOLANGIOGRAPH 5 50 .035 (SET/KITS/TRAYS/PACK) ×4 IMPLANT
SET IRRIG TUBING LAPAROSCOPIC (IRRIGATION / IRRIGATOR) ×4 IMPLANT
SLEEVE ENDOPATH XCEL 5M (ENDOMECHANICALS) ×8 IMPLANT
SPECIMEN JAR SMALL (MISCELLANEOUS) ×4 IMPLANT
SUT MON AB 4-0 PC3 18 (SUTURE) ×4 IMPLANT
TOWEL OR 17X24 6PK STRL BLUE (TOWEL DISPOSABLE) ×4 IMPLANT
TOWEL OR 17X26 10 PK STRL BLUE (TOWEL DISPOSABLE) ×4 IMPLANT
TRAY LAPAROSCOPIC MC (CUSTOM PROCEDURE TRAY) ×4 IMPLANT
TROCAR XCEL BLUNT TIP 100MML (ENDOMECHANICALS) ×4 IMPLANT
TROCAR XCEL NON-BLD 5MMX100MML (ENDOMECHANICALS) ×4 IMPLANT
TUBING INSUFFLATION (TUBING) ×4 IMPLANT

## 2016-06-15 NOTE — Anesthesia Postprocedure Evaluation (Signed)
Anesthesia Post Note  Patient: Laurie Peters  Procedure(s) Performed: Procedure(s) (LRB): LAPAROSCOPIC CHOLECYSTECTOMY (N/A)  Patient location during evaluation: PACU Anesthesia Type: General Level of consciousness: awake and alert Pain management: pain level controlled Vital Signs Assessment: post-procedure vital signs reviewed and stable Respiratory status: spontaneous breathing, nonlabored ventilation, respiratory function stable and patient connected to nasal cannula oxygen Cardiovascular status: blood pressure returned to baseline and stable Postop Assessment: no signs of nausea or vomiting Anesthetic complications: no    Last Vitals:  Vitals:   06/15/16 1330 06/15/16 1403  BP: 131/73 116/69  Pulse:  72  Resp:  17  Temp:  36.9 C    Last Pain:  Vitals:   06/15/16 1403  TempSrc: Oral  PainSc:                  [name]

## 2016-06-15 NOTE — Op Note (Signed)
Laurie Peters, Laurie Peters NO.:  [id number]  MEDICAL RECORD NO.:  [id number]  LOCATION:  [id number]                        FACILITY:  [facility]  PHYSICIAN:  Coralie Keens, M.D. DATE OF BIRTH:  [birth year]  DATE OF PROCEDURE:  06/15/2016 DATE OF DISCHARGE:                              OPERATIVE REPORT   PREOPERATIVE DIAGNOSES:  Acute cholecystitis with cholelithiasis.  POSTOPERATIVE DIAGNOSES:  Acute cholecystitis with cholelithiasis.  PROCEDURE:  Laparoscopic cholecystectomy.  SURGEON:  Coralie Keens, M.D.  ASSISTANT:  Adin Hector, M.D.  ANESTHESIA:  General endotracheal anesthesia.  ESTIMATED BLOOD LOSS:  Minimal.  FINDINGS:  The patient was found to have acutely inflamed gallbladder with gangrenous changes and gallstones.  PROCEDURE IN DETAIL:  The patient was brought to the operating room, identified as [employer]  She was placed supine on the operating room table and general anesthesia was induced.  Her abdomen was then prepped and draped in the usual sterile fashion.  I made a small vertical incision above the umbilicus.  I took this down to the fascia which was then opened with a scalpel.  A hemostat was then used to pass through the peritoneal cavity under direct vision.  A 0 Vicryl pursestring suture was then placed around the fascial opening.  The Hasson port was placed through the opening and insufflation of the abdomen was begun.  I placed a 5-mm port in the patient's epigastrium and 2 more in the right upper quadrant all under direct vision.  The patient's gallbladder was found to be acutely inflamed with gangrenous changes.  I aspirated bile in order to grasp it and retract it above the liver.  Once I was able to aspirate, I could retract the gallbladder above the fatty liver.  It was very friable.  The tissue appeared to be tearing easily.  At this point, because of this, I decided to forego cholangiogram.  I was able to dissect it out the  cystic duct and achieve a critical window around it. It was then clipped three times proximally, once distally, and transected.  The cystic artery was then identified, clipped proximally and distally, and transected as well.  The gallbladder was then slowly dissected free from the liver bed with the electrocautery.  Once it was free from the liver bed, hemostasis appeared to be achieved with cautery as well as a piece of surgical snow.  I then placed the gallbladder in an endosac.  Several small stones were placed in the endosac as well and the gallbladder was removed through the umbilicus.  We then copiously irrigated the abdomen with normal saline.  All stones appeared to be removed.  Hemostasis also appeared to be achieved.  All ports were removed under direct vision and the abdomen was deflated.  With 0 Vicryl the umbilicus was tied in place closing the fascial defect.  All other incisions were anesthetized with Marcaine and closed with 4-0 Monocryl subcuticular sutures.  Skin glue was then applied.  The patient tolerated the procedure well.  All the counts were correct at the end of the procedure.  The patient was then extubated in the operating room and taken in stable condition to recovery room.  Coralie Keens, M.D.     DB/MEDQ  D:  06/15/2016  T:  06/15/2016  Job:  [id number]

## 2016-06-15 NOTE — Transfer of Care (Signed)
Immediate Anesthesia Transfer of Care Note  Patient: Laurie Peters  Procedure(s) Performed: Procedure(s): LAPAROSCOPIC CHOLECYSTECTOMY (N/A)  Patient Location: PACU  Anesthesia Type:General  Level of Consciousness: awake  Airway & Oxygen Therapy: Patient Spontanous Breathing and Patient connected to nasal cannula oxygen  Post-op Assessment: Report given to RN and Post -op Vital signs reviewed and stable  Post vital signs: Reviewed and stable  Last Vitals:  Vitals:   06/15/16 0556 06/15/16 1019  BP: (!) 128/57 140/81  Pulse: 72 95  Resp: 18 19  Temp: 36.9 C 37 C    Last Pain:  Vitals:   06/15/16 1019  TempSrc: Oral  PainSc:       Patients Stated Pain Goal: 3 ([id number])  Complications: No apparent anesthesia complications

## 2016-06-15 NOTE — Progress Notes (Signed)
Patient ID: Laurie Peters, female   DOB: 05/19/1961, 55 y.o.   MRN: [id number]  Patient with symptomatic cholelithiasis. Plan lap chole today I discussed the procedure in detail.  We discussed the risks and benefits of a laparoscopic cholecystectomy and cholangiogram including, but not limited to bleeding, infection, injury to surrounding structures such as the intestine or liver, bile leak, retained gallstones, need to convert to an open procedure, prolonged diarrhea, blood clots such as  DVT, common bile duct injury, anesthesia risks, and possible need for additional procedures.  The likelihood of improvement in symptoms and return to the patient's normal status is good. We discussed the typical post-operative recovery course.

## 2016-06-15 NOTE — Anesthesia Procedure Notes (Signed)
Procedure Name: Intubation Date/Time: 06/15/2016 11:34 AM Performed by: Marinda Elk A Pre-anesthesia Checklist: Patient identified, Emergency Drugs available, Suction available, Patient being monitored and Timeout performed Patient Re-evaluated:Patient Re-evaluated prior to inductionOxygen Delivery Method: Circle System Utilized and Circle system utilized Preoxygenation: Pre-oxygenation with 100% oxygen Intubation Type: IV induction Ventilation: Mask ventilation without difficulty Laryngoscope Size: Glidescope Grade View: Grade I Tube type: Oral Tube size: 7.5 mm Number of attempts: 1 Airway Equipment and Method: Stylet and Video-laryngoscopy Placement Confirmation: ETT inserted through vocal cords under direct vision,  positive ETCO2 and breath sounds checked- equal and bilateral Secured at: 21 cm Tube secured with: Tape Dental Injury: Teeth and Oropharynx as per pre-operative assessment

## 2016-06-15 NOTE — Anesthesia Preprocedure Evaluation (Addendum)
Anesthesia Evaluation  Patient identified by MRN, date of birth, ID band Patient awake    Reviewed: Allergy & Precautions, NPO status , Patient's Chart, lab work & pertinent test results  Airway Mallampati: II  TM Distance: >3 FB Neck ROM: Full    Dental  (+) Dental Advisory Given,    Pulmonary asthma ,    breath sounds clear to auscultation       Cardiovascular hypertension, Pt. on medications  Rhythm:Regular Rate:Normal     Neuro/Psych negative neurological ROS     GI/Hepatic negative GI ROS, Neg liver ROS,   Endo/Other  Morbid obesity  Renal/GU negative Renal ROS     Musculoskeletal   Abdominal   Peds  Hematology negative hematology ROS (+)   Anesthesia Other Findings   Reproductive/Obstetrics                           Lab Results  Component Value Date   WBC 12.5 (H) 06/15/2016   HGB 14.0 06/15/2016   HCT 43.3 06/15/2016   MCV 95.8 06/15/2016   PLT 392 06/15/2016   Lab Results  Component Value Date   CREATININE 0.71 06/15/2016   BUN <5 (L) 06/15/2016   NA 134 (L) 06/15/2016   K 3.7 06/15/2016   CL 105 06/15/2016   CO2 24 06/15/2016    Anesthesia Physical Anesthesia Plan  ASA: III  Anesthesia Plan: General   Post-op Pain Management:    Induction: Intravenous  Airway Management Planned: Oral ETT  Additional Equipment:   Intra-op Plan:   Post-operative Plan: Extubation in OR  Informed Consent: I have reviewed the patients History and Physical, chart, labs and discussed the procedure including the risks, benefits and alternatives for the proposed anesthesia with the patient or authorized representative who has indicated his/her understanding and acceptance.   Dental advisory given  Plan Discussed with: CRNA  Anesthesia Plan Comments:        Anesthesia Quick Evaluation

## 2016-06-15 NOTE — Op Note (Signed)
LAPAROSCOPIC CHOLECYSTECTOMY  Procedure Note  Laurie Peters 06/14/2016 - 06/15/2016   Pre-op Diagnosis: cholecystitis with cholelithiasis     Post-op Diagnosis: same  Procedure(s): LAPAROSCOPIC CHOLECYSTECTOMY  Surgeon(s): Michael Boston, MD Coralie Keens, MD  Anesthesia: General  Staff:  Circulator: Candie Mile, RN Scrub Person: Carmela D Ingles; Lorenza Burton, CST  Estimated Blood Loss: Minimal               Specimens: sent to path          [facility]   Date: 06/15/2016  Time: 12:37 PM

## 2016-06-16 ENCOUNTER — Encounter (INDEPENDENT_AMBULATORY_CARE_PROVIDER_SITE_OTHER): Payer: Self-pay | Admitting: Physician Assistant

## 2016-06-16 ENCOUNTER — Encounter (HOSPITAL_COMMUNITY): Payer: Self-pay | Admitting: Surgery

## 2016-06-16 MED ORDER — ONDANSETRON 4 MG PO TBDP: 4.0000 mg | ORAL_TABLET | Freq: Four times a day (QID) | ORAL | 0 refills | Status: DC | PRN

## 2016-06-16 MED ORDER — FAMOTIDINE 20 MG PO TABS: 20.0000 mg | ORAL_TABLET | Freq: Two times a day (BID) | ORAL | Status: DC

## 2016-06-16 MED ORDER — OXYCODONE HCL 5 MG PO TABS: 5.0000 mg | ORAL_TABLET | ORAL | 0 refills | Status: DC | PRN

## 2016-06-16 MED ORDER — LEVOFLOXACIN 750 MG PO TABS: 750.0000 mg | ORAL_TABLET | Freq: Every day | ORAL | 0 refills | Status: AC

## 2016-06-16 MED ORDER — ENOXAPARIN SODIUM 40 MG/0.4ML ~~LOC~~ SOLN
40.0000 mg | SUBCUTANEOUS | Status: DC
  Administered 2016-06-16: 40 mg via SUBCUTANEOUS
  Filled 2016-06-16: qty 0.4

## 2016-06-16 NOTE — Discharge Instructions (Addendum)
LAPAROSCOPIC SURGERY: POST OP INSTRUCTIONS  1. DIET: Follow a light bland diet the first 24 hours after arrival home, such as soup, liquids, crackers, etc. Be sure to include lots of fluids daily. Avoid fast food or heavy meals as your are more likely to get nauseated. Eat a low fat the next few days after surgery.  2. Take your usually prescribed home medications unless otherwise directed. 3. PAIN CONTROL:  1. Pain is best controlled by a usual combination of three different methods TOGETHER:  1. Ice/Heat 2. Over the counter pain medication 3. Prescription pain medication 2. Most patients will experience some swelling and bruising around the incisions. Ice packs or heating pads (30-60 minutes up to 6 times a day) will help. Use ice for the first few days to help decrease swelling and bruising, then switch to heat to help relax tight/sore spots and speed recovery. Some people prefer to use ice alone, heat alone, alternating between ice & heat. Experiment to what works for you. Swelling and bruising can take several weeks to resolve.  3. It is helpful to take an over-the-counter pain medication regularly for the first few weeks. Choose one of the following that works best for you:  1. Naproxen (Aleve, etc) Two 220mg  tabs twice a day 2. Ibuprofen (Advil, etc) Three 200mg  tabs four times a day (every meal & bedtime) 3. Acetaminophen (Tylenol, etc) 500-650mg  four times a day (every meal & bedtime) 4. A prescription for pain medication (such as oxycodone, hydrocodone, etc) should be given to you upon discharge. Take your pain medication as prescribed.  1. If you are having problems/concerns with the prescription medicine (does not control pain, nausea, vomiting, rash, itching, etc), please call us [phone] to see if we need to switch you to a different pain medicine that will work better for you and/or control your side effect better. 2. If you need a refill on your pain medication, please contact  your pharmacy. They will contact our office to request authorization. Prescriptions will not be filled after 5 pm or on week-ends. 4. Avoid getting constipated. Between the surgery and the pain medications, it is common to experience some constipation. Increasing fluid intake and taking a fiber supplement (such as Metamucil, Citrucel, FiberCon, MiraLax, etc) 1-2 times a day regularly will usually help prevent this problem from occurring. A mild laxative (prune juice, Milk of Magnesia, MiraLax, etc) should be taken according to package directions if there are no bowel movements after 48 hours.  5. Watch out for diarrhea. If you have many loose bowel movements, simplify your diet to bland foods & liquids for a few days. Stop any stool softeners and decrease your fiber supplement. Switching to mild anti-diarrheal medications (Kayopectate, Pepto Bismol) can help. If this worsens or does not improve, please call us. 6. Wash / shower every day. You may shower over the dressings as they are waterproof. Continue to shower over incision(s) after the dressing is off. 7. Remove your waterproof bandages 5 days after surgery. You may leave the incision open to air. You may replace a dressing/Band-Aid to cover the incision for comfort if you wish.  8. ACTIVITIES as tolerated:  1. You may resume regular (light) daily activities beginning the next day--such as daily self-care, walking, climbing stairs--gradually increasing activities as tolerated. If you can walk 30 minutes without difficulty, it is safe to try more intense activity such as jogging, treadmill, bicycling, low-impact aerobics, swimming, etc. 2. Save the most intensive and strenuous activity for  last such as sit-ups, heavy lifting, contact sports, etc Refrain from any heavy lifting or straining until you are off narcotics for pain control.  3. DO NOT PUSH THROUGH PAIN. Let pain be your guide: If it hurts to do something, don't do it. Pain is your body warning  you to avoid that activity for another week until the pain goes down. 4. You may drive when you are no longer taking prescription pain medication, you can comfortably wear a seatbelt, and you can safely maneuver your car and apply brakes. 5. You may have sexual intercourse when it is comfortable.  9. FOLLOW UP in our office  1. Please call [facility] at [phone] to set up an appointment to see your surgeon in the office for a follow-up appointment approximately 2-3 weeks after your surgery. 2. Make sure that you call for this appointment the day you arrive home to insure a convenient appointment time.      10. IF YOU HAVE DISABILITY OR FAMILY LEAVE FORMS, BRING THEM TO THE               OFFICE FOR PROCESSING.   WHEN TO CALL us [phone]:  1. Poor pain control 2. Reactions / problems with new medications (rash/itching, nausea, etc)  3. Fever over 101.5 F (38.5 C) 4. Inability to urinate 5. Nausea and/or vomiting 6. Worsening swelling or bruising 7. Continued bleeding from incision. 8. Increased pain, redness, or drainage from the incision  The clinic staff is available to answer your questions during regular business hours (8:30am-5pm). Please dont hesitate to call and ask to speak to one of our nurses for clinical concerns.  If you have a medical emergency, go to the nearest emergency room or call 911.  A surgeon from [facility] is always on call at the [facility], [place], [place], [place], [place] [address] ?  MAIN: [phone] ? TOLL FREE: [phone] ?  FAX [phone]  [email or web address]    **Please follow-up with your primary care physical regarding incidental finding of pulmonary nodules on CT scan**

## 2016-06-16 NOTE — Progress Notes (Signed)
Patient ID: Laurie Peters, female   DOB: 27-Jun-1961, 55 y.o.   MRN: [id number]  [facility] Progress Note  1 Day Post-Op  Subjective: States that she was feeling great last night and early this morning, now with increased abdominal pain and soreness. Feels like she just over did it. Passing gas, no BM.   Objective: Vital signs in last 24 hours: Temp:  [97.8 F (36.6 C)-98.6 F (37 C)] 98.1 F (36.7 C) (09/25 0514) Pulse Rate:  [60-98] 66 (09/25 0514) Resp:  [17-19] 18 (09/25 0514) BP: (116-153)/(49-74) 136/54 (09/25 0548) SpO2:  [96 %-100 %] 100 % (09/25 0514) Last BM Date: 06/09/16  Intake/Output from previous day: 09/24 0701 - 09/25 0700 In: 3278.9 [P.O.:470; I.V.:2808.9] Out: 1360 [Urine:1350; Blood:10] Intake/Output this shift: Total I/O In: -  Out: 600 [Urine:600]  PE: Gen:  Alert, NAD, pleasant Abd: Soft, obese, appropriately tender, hypoactive BS, incisions C/D/I  Lab Results:   Recent Labs  06/14/16 0746 06/15/16 0147  WBC 8.1 12.5*  HGB 14.7 14.0  HCT 43.6 43.3  PLT 367 392   BMET  Recent Labs  06/14/16 0746 06/15/16 0147  NA 137 134*  K 4.0 3.7  CL 104 105  CO2 21* 24  GLUCOSE 112* 117*  BUN 6 <5*  CREATININE 0.62 0.71  CALCIUM 9.2 8.3*   PT/INR No results for input(s): LABPROT, INR in the last 72 hours. CMP     Component Value Date/Time   NA 134 (L) 06/15/2016 0147   K 3.7 06/15/2016 0147   CL 105 06/15/2016 0147   CO2 24 06/15/2016 0147   GLUCOSE 117 (H) 06/15/2016 0147   BUN <5 (L) 06/15/2016 0147   CREATININE 0.71 06/15/2016 0147   CALCIUM 8.3 (L) 06/15/2016 0147   PROT 6.5 06/15/2016 0147   ALBUMIN 2.9 (L) 06/15/2016 0147   AST 128 (H) 06/15/2016 0147   ALT 232 (H) 06/15/2016 0147   ALKPHOS 55 06/15/2016 0147   BILITOT 0.8 06/15/2016 0147   GFRNONAA >60 06/15/2016 0147   GFRAA >60 06/15/2016 0147   Lipase     Component Value Date/Time   LIPASE 23 06/14/2016 0746       Studies/Results: Dg Chest 2  View  Result Date: 06/15/2016 CLINICAL DATA:  Shortness of breath, chest tightness EXAM: CHEST  2 VIEW COMPARISON:  06/14/2016 FINDINGS: Mild patchy right basilar opacity, new/ increased, atelectasis versus pneumonia. Left lung is clear. No pleural effusion or pneumothorax. The heart is normal in size. Mild degenerative changes of the visualized thoracolumbar spine. IMPRESSION: Right lower lobe opacity, new, atelectasis versus pneumonia. Electronically Signed   By: Julian Hy M.D.   On: 06/15/2016 09:39   Ct Renal Stone Study  Addendum Date: 06/14/2016   ADDENDUM REPORT: 06/14/2016 11:46 ADDENDUM: The right middle lobe lung nodules are not visible on the chest radiographs obtained earlier today. Electronically Signed   By: Claudie Revering M.D.   On: 06/14/2016 11:46   Result Date: 06/14/2016 CLINICAL DATA:  Right the upper quadrant abdominal pain, nausea and vomiting since 2 a.m. today. Right hydronephrosis at ultrasound earlier today. EXAM: CT ABDOMEN AND PELVIS WITHOUT CONTRAST TECHNIQUE: Multidetector CT imaging of the abdomen and pelvis was performed following the standard protocol without IV contrast. COMPARISON:  Right upper quadrant abdomen ultrasound obtained earlier today. FINDINGS: Lower chest: Minimal bilateral dependent atelectasis. 8 x 4 mm oval nodular density in the lateral aspect of the right middle lobe on the first image. Two adjacent 5 mm nodular densities  in the lateral aspect of the right lower lobe on image 5. Minimal bilateral lower lobe dependent atelectasis. Hepatobiliary: Multiple gallstones in the gallbladder. No gallbladder wall thickening or pericholecystic fluid. Normal appearing liver. Pancreas: Unremarkable. No pancreatic ductal dilatation or surrounding inflammatory changes. Spleen: Normal in size without focal abnormality. Adrenals/Urinary Tract: Two low-density left adrenal masses. One measures 1.7 x 1.5 cm and -4 Hounsfield units on image number 24 and the other  measures 2.3 x 1.2 cm and -13 Hounsfield units on image number 20 of series 2. Normal appearing right adrenal gland. Rounded, predominantly fat density mass in the anterior aspect of the upper pole of the right kidney, measuring 2.2 x 1.8 cm on image number 28 of series 2. Multiple bilateral parapelvic renal cysts. No hydronephrosis seen. Normal appearing ureters. Poorly distended urinary bladder with no visible abnormality. The Stomach/Bowel: Stomach is within normal limits. Appendix appears normal. No evidence of bowel wall thickening, distention, or inflammatory changes. Vascular/Lymphatic: No significant vascular findings are present. No enlarged abdominal or pelvic lymph nodes. Reproductive: Enlarged uterus containing multiple poorly defined masses. Known uterine fibroids by history. No adnexal masses. Other: No free peritoneal fluid. Musculoskeletal: Mild bilateral hip degenerative changes. Mild lumbar and lower thoracic spine degenerative changes. IMPRESSION: 1. No acute abnormality. 2. Cholelithiasis. 3. 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 5. Bilateral renal parapelvic cysts without hydronephrosis. 6. Three right middle lobe nodules. The largest has an average diameter of 6 mm and is not included in its entirety. Non-contrast chest CT at 6-12 months is recommended. If the nodule is stable at time of repeat CT, then future CT at 18-24 months (from today's scan) is considered optional for low-risk patients, but is recommended for high-risk patients. This recommendation follows the consensus statement: Guidelines for Management of Incidental Pulmonary Nodules Detected on CT Images: From the Fleischner Society 2017; Radiology 2017; 284:228-243. Electronically Signed: By: Claudie Revering M.D. On: 06/14/2016 11:42    Anti-infectives: Anti-infectives    Start     Dose/Rate Route Frequency Ordered Stop   06/15/16 1000  cefTRIAXone (ROCEPHIN) 2 g in dextrose 5 % 50 mL IVPB     2 g 100 mL/hr  over 30 Minutes Intravenous Every 24 hours 06/14/16 1501     06/14/16 1215  cefTRIAXone (ROCEPHIN) 2 g in dextrose 5 % 50 mL IVPB     2 g 100 mL/hr over 30 Minutes Intravenous  Once 06/14/16 1212 06/14/16 1307       Assessment/Plan S/p laparoscopic cholecystectomy 06/15/16 Dr. Ninfa Linden - POD 1 - gallbladder was found to be inflamed with gangrenous changes and gallstones. Will discharge on 5 days of Levaquin when ready for d/c - decreased appetite, currently having increased pain - no BM, +flatus  3 right lung middle lobe nodules - will need OP follow-up with PCP  ID - rocephin day 2 FEN - heart healthy VTE - SCDs, lovenox  Plan - increased pain after doing too much activity this morning. Recommend short period of rest and PO pain medication, then encourage mobilization. Will recheck later today for possible discharge.   LOS: 2 days    Jerrye Beavers , [facility] 06/16/2016, 10:31 AM Pager: [phone] Consults: [phone] Mon-Fri 7:00 am-4:30 pm Sat-Sun 7:00 am-11:30 am

## 2016-06-16 NOTE — Care Management Note (Signed)
Case Management Note  Patient Details  Name: Imogen Magill MRN: [id number] Date of Birth: March 02, 1961  Subjective/Objective:                    Action/Plan:  Confirmed with patient , she has no insurance. [place] letter given and explained patient voiced understanding  Expected Discharge Date:                  Expected Discharge Plan:  Home/Self Care  In-House Referral:     Discharge planning Services  CM Consult, [facility], [facility]  Post Acute Care Choice:    Choice offered to:  Patient  DME Arranged:    DME Agency:     HH Arranged:    [place] Agency:     Status of Service:  Completed, signed off  If discussed at [employer], dates discussed:    Additional Comments:  Marilu Favre, RN 06/16/2016, 3:55 PM

## 2016-06-16 NOTE — Progress Notes (Signed)
Discharge home. Home discharge instruction given, no question verbalized. 

## 2016-06-16 NOTE — Discharge Summary (Signed)
[facility] Discharge Summary   Patient ID: Laurie Peters MRN: [id number] DOB/AGE: 1961/04/14 55 y.o.  Admit date: 06/14/2016 Discharge date: 06/16/2016  Admitting Diagnosis: Cholecystitis RUQ pain  Discharge Diagnosis Patient Active Problem List   Diagnosis Date Noted  . Chronic cholecystitis with calculus 06/14/2016    Consultants None  Imaging: Dg Chest 2 View  Result Date: 06/15/2016 CLINICAL DATA:  Shortness of breath, chest tightness EXAM: CHEST  2 VIEW COMPARISON:  06/14/2016 FINDINGS: Mild patchy right basilar opacity, new/ increased, atelectasis versus pneumonia. Left lung is clear. No pleural effusion or pneumothorax. The heart is normal in size. Mild degenerative changes of the visualized thoracolumbar spine. IMPRESSION: Right lower lobe opacity, new, atelectasis versus pneumonia. Electronically Signed   By: Julian Hy M.D.   On: 06/15/2016 09:39   US Abdomen limited RUQ 06/14/16: 1. No evidence of acute cholecystitis. 2. Small gallstones and gallbladder sludge. 3. Mild increased echogenicity of the liver suggesting mild hepatic steatosis. 4. Right hydronephrosis. Patient may have obstructive uropathy on the right. Consider follow-up unenhanced CT of the abdomen pelvis for further assessment.  CT renal stone study 06/14/16: 1. No acute abnormality. 2. Cholelithiasis. 3. 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 5. Bilateral renal parapelvic cysts without hydronephrosis. 6. Three right middle lobe nodules. The largest has an average diameter of 6 mm and is not included in its entirety. Non-contrast chest CT at 6-12 months is recommended. If the nodule is stable at time of repeat CT, then future CT at 18-24 months (from today's scan) is considered optional for low-risk patients, but is recommended for high-risk patients. This recommendation follows the consensus statement: Guidelines for Management of Incidental  Pulmonary Nodules  Detected on CT Images: From the Fleischner Society 2017; Radiology 2017; 284:228-243.   Procedures Dr. Ninfa Linden (06/15/16) - Laparoscopic Cholecystectomy  Hospital Course:  Laurie Peters is a 55yo female who [facility] to [facility]14[facility]16 with acute onset abdominal pain with associated nausea and vomiting.  Workup showed elevated LFT's and gallbladder sludge and stones.  She was also incidentally found to have 3 right lobe lung nodules, which she knows to follow-up with her PCP about. Patient was admitted and underwent procedure listed above.  Tolerated procedure well and was transferred to the floor.  On POD1 the patient was voiding well, tolerating diet, ambulating well, pain well controlled, vital signs stable, incisions c/d/i and felt stable for discharge home.  Patient will follow up in our office in 3 weeks and knows to call with questions or concerns.    Physical Exam: Gen:  Alert, NAD, pleasant Abd: Soft, obese, appropriately tender, hypoactive BS, incisions C/D/I    Medication List    TAKE these medications   albuterol (2.5 MG/3ML) 0.083% nebulizer solution Commonly known as:  PROVENTIL Take 2.5 mg by nebulization every 4 (four) hours as needed for wheezing or shortness of breath.   albuterol 108 (90 Base) MCG/ACT inhaler Commonly known as:  PROVENTIL HFA;VENTOLIN HFA Inhale 2 puffs into the lungs every 4 (four) hours as needed for wheezing or shortness of breath.   ALEVE 220 MG tablet Generic drug:  naproxen sodium Take 440 mg by mouth 2 (two) times daily as needed (pain/ headache).   ferrous sulfate 325 (65 FE) MG tablet Take 325 mg by mouth daily with breakfast.   hydrochlorothiazide 12.5 MG tablet Commonly known as:  HYDRODIURIL Take 12.5 mg by mouth daily.   levofloxacin 750 MG tablet Commonly known as:  LEVAQUIN Take 1 tablet (750  mg total) by mouth daily.   norethindrone 5 MG tablet Commonly known as:  AYGESTIN Take 10 mg by mouth daily.   ondansetron 4 MG  disintegrating tablet Commonly known as:  ZOFRAN-ODT Take 1 tablet (4 mg total) by mouth every 6 (six) hours as needed for nausea.   oxyCODONE 5 MG immediate release tablet Commonly known as:  Oxy IR/ROXICODONE Take 1-2 tablets (5-10 mg total) by mouth every 4 (four) hours as needed for moderate pain.   Vitamin B Complex Tabs Take 1 tablet by mouth daily.        Follow-up Information    Harl Bowie, MD .   Specialty:  General Surgery Why:  Your appointment is 07/04/2016 at 10:30am, please arrive at least 30 min before your appointment to complete your check in paperwork.  If you are unable to arrive 30 min prior to your appointment time we may have to cancel or reschedule you. Contact information: [address] [place] [address] [phone]           Signed: Jerrye Beavers, [facility] 06/16/2016, 3:27 PM Pager: [phone] Consults: [phone] Mon-Fri 7:00 am-4:30 pm Sat-Sun 7:00 am-11:30 am

## 2017-01-09 ENCOUNTER — Encounter: Payer: Self-pay | Admitting: Family Medicine

## 2017-11-05 ENCOUNTER — Encounter (HOSPITAL_COMMUNITY): Payer: Self-pay | Admitting: Emergency Medicine

## 2017-11-05 ENCOUNTER — Emergency Department (HOSPITAL_COMMUNITY): Payer: Self-pay

## 2017-11-05 ENCOUNTER — Emergency Department (HOSPITAL_COMMUNITY)
Admission: EM | Admit: 2017-11-05 | Discharge: 2017-11-05 | Disposition: A | Discharge status: Home / Self Care | Payer: Self-pay | Attending: Emergency Medicine | Admitting: Emergency Medicine

## 2017-11-05 DIAGNOSIS — H538 Other visual disturbances: Secondary | ICD-10-CM

## 2017-11-05 DIAGNOSIS — J45909 Unspecified asthma, uncomplicated: Secondary | ICD-10-CM | POA: Insufficient documentation

## 2017-11-05 DIAGNOSIS — R0602 Shortness of breath: Secondary | ICD-10-CM

## 2017-11-05 DIAGNOSIS — R079 Chest pain, unspecified: Secondary | ICD-10-CM

## 2017-11-05 DIAGNOSIS — I10 Essential (primary) hypertension: Secondary | ICD-10-CM | POA: Insufficient documentation

## 2017-11-05 DIAGNOSIS — Z79899 Other long term (current) drug therapy: Secondary | ICD-10-CM | POA: Insufficient documentation

## 2017-11-05 LAB — CBC
HCT: 43.6 % (ref 36.0–46.0)
Hemoglobin: 14.7 g/dL (ref 12.0–15.0)
MCH: 32 pg (ref 26.0–34.0)
MCHC: 33.7 g/dL (ref 30.0–36.0)
MCV: 95 fL (ref 78.0–100.0)
Platelets: 481 10*3/uL — ABNORMAL HIGH (ref 150–400)
RBC: 4.59 MIL/uL (ref 3.87–5.11)
RDW: 13.7 % (ref 11.5–15.5)
WBC: 8.9 10*3/uL (ref 4.0–10.5)

## 2017-11-05 LAB — BASIC METABOLIC PANEL
ANION GAP: 9 (ref 5–15)
BUN: 10 mg/dL (ref 6–20)
CALCIUM: 9.4 mg/dL (ref 8.9–10.3)
CO2: 23 mmol/L (ref 22–32)
CREATININE: 0.78 mg/dL (ref 0.44–1.00)
Chloride: 108 mmol/L (ref 101–111)
GLUCOSE: 87 mg/dL (ref 65–99)
Potassium: 4.2 mmol/L (ref 3.5–5.1)
Sodium: 140 mmol/L (ref 135–145)

## 2017-11-05 LAB — D-DIMER, QUANTITATIVE: D-Dimer, Quant: 1.09 ug/mL-FEU — ABNORMAL HIGH (ref 0.00–0.50)

## 2017-11-05 LAB — BRAIN NATRIURETIC PEPTIDE: B Natriuretic Peptide: 7.2 pg/mL (ref 0.0–100.0)

## 2017-11-05 LAB — I-STAT TROPONIN, ED: TROPONIN I, POC: 0 ng/mL (ref 0.00–0.08)

## 2017-11-05 LAB — I-STAT BETA HCG BLOOD, ED (MC, WL, AP ONLY): I-stat hCG, quantitative: <5 m[IU]/mL (ref <5)

## 2017-11-05 MED ORDER — ALBUTEROL SULFATE (2.5 MG/3ML) 0.083% IN NEBU: 2.5000 mg | INHALATION_SOLUTION | RESPIRATORY_TRACT | 0 refills | Status: DC | PRN

## 2017-11-05 MED ORDER — ALBUTEROL SULFATE (2.5 MG/3ML) 0.083% IN NEBU
5.0000 mg | INHALATION_SOLUTION | Freq: Once | RESPIRATORY_TRACT | Status: AC
  Administered 2017-11-05: 5 mg via RESPIRATORY_TRACT
  Filled 2017-11-05: qty 6

## 2017-11-05 MED ORDER — IOPAMIDOL (ISOVUE-370) INJECTION 76%
100.0000 mL | Freq: Once | INTRAVENOUS | Status: AC | PRN
  Administered 2017-11-05: 100 mL via INTRAVENOUS

## 2017-11-05 MED ORDER — PREDNISONE 50 MG PO TABS: 50.0000 mg | ORAL_TABLET | Freq: Every day | ORAL | 0 refills | Status: AC

## 2017-11-05 MED ORDER — ALBUTEROL SULFATE HFA 108 (90 BASE) MCG/ACT IN AERS: 1.0000 | INHALATION_SPRAY | Freq: Four times a day (QID) | RESPIRATORY_TRACT | 0 refills | Status: DC | PRN

## 2017-11-05 MED ORDER — IOPAMIDOL (ISOVUE-370) INJECTION 76%
INTRAVENOUS | Status: AC
  Filled 2017-11-05: qty 100
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
## 2017-11-05 NOTE — ED Notes (Signed)
Patient ambulated no assistance, patient's o2 level went down to 97%, heart rate up to 128

## 2017-11-05 NOTE — ED Notes (Signed)
EDPA Provider at bedside. 
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
## 2017-11-05 NOTE — Discharge Instructions (Signed)
Your work up today is reassuring, CT of the chest shows no evidence of blood clot or other acute problem, it does show  a 13 mm ground-glass nodule in the right middle lobe. This nodule measured 8 mm on a 2017 abdominal CT, but was partially covered at that time and the presence of growth is uncertain. Recommend initial follow-up with CT at 6-12 months with your primary doctor. Labs and EKG overall reassuring and do not show any other acute cardiac or pulmonary cause for your symptoms. Your asthma may be contributing, please continue to use your inhaler and nebulizers every 4 hours as needed, please complete 5-day course of steroids as well.  Blurry vision may be due to eye strain, please wear your glasses more consistently. It is very important that you follow-up outpatient and establish a primary care provider, please use the phone number provided to establish primary care or follow-up with the [facility].  If you have worsening or persistent chest pain or shortness of breath, blurry vision becomes constant or you develop any other new or concerning symptoms please return to the ED for reevaluation.

## 2017-11-05 NOTE — ED Notes (Signed)
LAB CALLED AND WILL RUN BNP

## 2017-11-05 NOTE — ED Notes (Signed)
Patient transported to CT 

## 2017-11-05 NOTE — ED Notes (Signed)
Save blue tube in main lab. Lab will add on D-dimer

## 2017-12-11 ENCOUNTER — Ambulatory Visit: Payer: Self-pay | Admitting: Family Medicine

## 2017-12-25 ENCOUNTER — Encounter: Payer: Self-pay | Admitting: Family Medicine

## 2017-12-25 ENCOUNTER — Ambulatory Visit (INDEPENDENT_AMBULATORY_CARE_PROVIDER_SITE_OTHER): Payer: BLUE CROSS/BLUE SHIELD | Admitting: Family Medicine

## 2017-12-25 VITALS — BP 144/98 | HR 84 | Temp 98.3°F | Ht 66.0 in | Wt 349.5 lb

## 2017-12-25 DIAGNOSIS — Z7689 Persons encountering health services in other specified circumstances: Secondary | ICD-10-CM

## 2017-12-25 DIAGNOSIS — J4520 Mild intermittent asthma, uncomplicated: Secondary | ICD-10-CM

## 2017-12-25 DIAGNOSIS — I10 Essential (primary) hypertension: Secondary | ICD-10-CM

## 2017-12-25 MED ORDER — CHLORTHALIDONE 25 MG PO TABS: 25.0000 mg | ORAL_TABLET | Freq: Every day | ORAL | 3 refills | Status: DC

## 2017-12-25 NOTE — Progress Notes (Signed)
Patient presents to clinic today to follow-up on chronic issues and to establish care.  Patient is accompanied by her daughter.  SUBJECTIVE: PMH: Pt is a 57 yo female with pmh sig for hypertension, asthma.  Patient was previously seen in [facility].  Asthma: -Patient taking albuterol inhaler as needed -Patient notices changes in the weather  HTN: -Patient is not currently on medication -Patient has been told in the past that she needed to start medication but has been hesitant. -Patient does not check BP at home -Patient is not currently exercising given her h/o increased uterine bleeding 2/2 fibroids -Patient endorses cooking mostly at home.  Allergies: TheraFlu-itchy eyes, throat swelling  Past surgical history: -Cholecystectomy  Social history: Pt is divorced.  Pt currently works as a Restaurant manager, fast food.  She has 4 children.  Pt denies tobacco, alcohol, drug use.  Health Maintenance: Vision --last eye exam 03/2017 PAP --11/19/2017   Past Medical History:  Diagnosis Date  . Asthma   . Heart murmur   . Hypertension   . Uterine fibroids affecting pregnancy, antepartum     Past Surgical History:  Procedure Laterality Date  . CHOLECYSTECTOMY N/A 06/15/2016   Procedure: LAPAROSCOPIC CHOLECYSTECTOMY;  Surgeon: Coralie Keens, MD;  Location: [facility];  Service: General;  Laterality: N/A;    Current Outpatient Medications on File Prior to Visit  Medication Sig Dispense Refill  . albuterol (PROAIR HFA) 108 (90 Base) MCG/ACT inhaler Inhale 1-2 puffs into the lungs every 6 (six) hours as needed for wheezing or shortness of breath.    Marland Kitchen albuterol (PROVENTIL HFA;VENTOLIN HFA) 108 (90 Base) MCG/ACT inhaler Inhale 2 puffs into the lungs every 4 (four) hours as needed for wheezing or shortness of breath.    Marland Kitchen albuterol (PROVENTIL) (2.5 MG/3ML) 0.083% nebulizer solution Take 2.5 mg by nebulization every 4 (four) hours as needed for wheezing or shortness of breath.     Marland Kitchen CALCIUM-VITAMIN D PO Take 1 tablet by mouth daily.    . Cyanocobalamin (VITAMIN B 12 PO) Take 1 tablet by mouth daily.    . IRON PO Take 1 tablet by mouth daily.    . norethindrone (AYGESTIN) 5 MG tablet Take 10 mg by mouth daily.    Marland Kitchen VITAMIN E PO Take 1 tablet by mouth daily.    . Vitamin D, Ergocalciferol, (DRISDOL) 50000 units CAPS capsule   0   No current facility-administered medications on file prior to visit.     Allergies  Allergen Reactions  . Theraflu Cold & [Phenylephrine-Pheniramine-Dm] Hives    Family History  Problem Relation Age of Onset  . Hypertension Mother   . Hypertension Father   . Heart disease Father   . Asthma Sister   . Asthma Son   . Hyperlipidemia Maternal Grandmother   . Heart disease Maternal Grandmother   . Hyperlipidemia Paternal Grandmother   . Heart disease Paternal Grandmother   . Cancer Paternal Grandmother   . Cancer Sister        uterus  . Heart disease Brother   . Heart disease Brother   . Diabetes Brother   . Heart disease Brother     Social History   Socioeconomic History  . Marital status: Single    Spouse name: Not on file  . Number of children: Not on file  . Years of education: Not on file  . Highest education level: Not on file  Occupational History  . Not on file  Social Needs  . Financial  resource strain: Not on file  . Food insecurity:    Worry: Not on file    Inability: Not on file  . Transportation needs:    Medical: Not on file    Non-medical: Not on file  Tobacco Use  . Smoking status: Never Smoker  . Smokeless tobacco: Never Used  Substance and Sexual Activity  . Alcohol use: Yes    Comment: "occassionally"  . Drug use: No  . Sexual activity: Not on file  Lifestyle  . Physical activity:    Days per week: Not on file    Minutes per session: Not on file  . Stress: Not on file  Relationships  . Social connections:    Talks on phone: Not on file    Gets together: Not on file    Attends  religious service: Not on file    Active member of club or organization: Not on file    Attends meetings of clubs or organizations: Not on file    Relationship status: Not on file  . Intimate partner violence:    Fear of current or ex partner: Not on file    Emotionally abused: Not on file    Physically abused: Not on file    Forced sexual activity: Not on file  Other Topics Concern  . Not on file  Social History Narrative  . Not on file    ROS General: Denies fever, chills, night sweats, changes in weight, changes in appetite HEENT: Denies headaches, ear pain, changes in vision, rhinorrhea, sore throat CV: Denies CP, palpitations, SOB, orthopnea Pulm: Denies SOB, cough, wheezing GI: Denies abdominal pain, nausea, vomiting, diarrhea, constipation GU: Denies dysuria, hematuria, frequency, vaginal discharge Msk: Denies muscle cramps, joint pains Neuro: Denies weakness, numbness, tingling Skin: Denies rashes, bruising Psych: Denies depression, anxiety, hallucinations  BP (!) 144/98 (BP Location: Right Arm, Patient Position: Sitting, Cuff Size: Large)   Pulse 84   Temp 98.3 F (36.8 C) (Oral)   Ht '5\' 6"'$  (1.676 m)   Wt (!) 349 lb 8 oz (158.5 kg)   LMP 09/23/2015 (Within Months) Comment: birth control  SpO2 96%   BMI 56.41 kg/m   Physical Exam Gen. Pleasant, well developed, well-nourished, in NAD HEENT - [place]/AT, PERRL, no scleral icterus, no nasal drainage, pharynx without erythema or exudate.  TMs normal bilaterally.  No cervical lymphadenopathy. Neck: No JVD, no thyromegaly, no carotid bruits Lungs: no use of accessory muscles, CTAB, no wheezes, rales or rhonchi Cardiovascular: RRR, No r/g/m, no peripheral edema Abdomen: BS present, soft, nontender, nondistended Neuro:  A&Ox3, CN II-XII intact, normal gait Skin:  Warm, dry, intact, no lesions Psych: normal affect, mood appropriate  Recent Results (from the past 2160 hour(s))  Basic metabolic panel     Status: None    Collection Time: 11/05/17 11:40 AM  Result Value Ref Range   Sodium 140 135 - 145 mmol/L   Potassium 4.2 3.5 - 5.1 mmol/L   Chloride 108 101 - 111 mmol/L   CO2 23 22 - 32 mmol/L   Glucose, Bld 87 65 - 99 mg/dL   BUN 10 6 - 20 mg/dL   Creatinine, Ser 0.78 0.44 - 1.00 mg/dL   Calcium 9.4 8.9 - 10.3 mg/dL   GFR calc non Af Amer >60 >60 mL/min   GFR calc Af Amer >60 >60 mL/min    Comment: (NOTE) The eGFR has been calculated using the CKD EPI equation. This calculation has not been validated in all clinical situations. eGFR's  persistently <60 mL/min signify possible Chronic Kidney Disease.    Anion gap 9 5 - 15    Comment: Performed at [facility], [place] [address] [place], [place] [address]  CBC     Status: Abnormal   Collection Time: 11/05/17 11:40 AM  Result Value Ref Range   WBC 8.9 4.0 - 10.5 K/uL   RBC 4.59 3.87 - 5.11 MIL/uL   Hemoglobin 14.7 12.0 - 15.0 g/dL   HCT 43.6 36.0 - 46.0 %   MCV 95.0 78.0 - 100.0 fL   MCH 32.0 26.0 - 34.0 pg   MCHC 33.7 30.0 - 36.0 g/dL   RDW 13.7 11.5 - 15.5 %   Platelets 481 (H) 150 - 400 K/uL    Comment: Performed at [facility], [place] [address], [place], [place] [address]  D-dimer, quantitative (not at [facility])     Status: Abnormal   Collection Time: 11/05/17 11:40 AM  Result Value Ref Range   D-Dimer, Quant 1.09 (H) 0.00 - 0.50 ug/mL-FEU    Comment: (NOTE) At the manufacturer cut-off of 0.50 ug/mL FEU, this assay has been documented to exclude PE with a sensitivity and negative predictive value of 97 to 99%.  At this time, this assay has not been approved by the FDA to exclude DVT/VTE. Results should be correlated with clinical presentation. Performed at [facility], [place] [address], [place], [place] [address]   Brain natriuretic peptide     Status: None   Collection Time: 11/05/17 11:40 AM  Result Value Ref Range   B Natriuretic Peptide 7.2 0.0 - 100.0 pg/mL    Comment:  Performed at [facility], [place] [address] [place], [place] [address]  I-stat troponin, ED     Status: None   Collection Time: 11/05/17 11:53 AM  Result Value Ref Range   Troponin i, poc 0.00 0.00 - 0.08 ng/mL   Comment 3            Comment: Due to the release kinetics of cTnI, a negative result within the first hours of the onset of symptoms does not rule out myocardial infarction with certainty. If myocardial infarction is still suspected, repeat the test at appropriate intervals.   I-Stat beta hCG blood, ED     Status: None   Collection Time: 11/05/17 11:54 AM  Result Value Ref Range   I-stat hCG, quantitative <5.0 <5 mIU/mL   Comment 3            Comment:   GEST. AGE      CONC.  (mIU/mL)   <=1 WEEK        5 - 50     2 WEEKS       50 - 500     3 WEEKS       100 - 10,000     4 WEEKS     1,000 - 30,000        FEMALE AND NON-PREGNANT FEMALE:     LESS THAN 5 mIU/mL     Assessment/Plan: Essential hypertension  -Pt encouraged to purchase a BP cuff for home use and to keep a log of readings -encouraged to make lifestyle modifications -Given handouts -BMP from 11/05/17 with normal potassium and creatinine - Plan: chlorthalidone (HYGROTON) 25 MG tablet  Encounter to establish care -We reviewed the PMH, PSH, FH, SH, Meds and Allergies. -We provided refills for any medications we will prescribe as needed. -We addressed current concerns per orders  and patient instructions. -We have asked for records for pertinent exams, studies, vaccines and notes from previous providers. -We have advised patient to follow up per instructions below.  Mild intermittent asthma, unspecified whether complicated -Continue using albuterol inhaler as needed -Avoid triggers  Follow-up in the next few weeks for BP recheck  Grier Mitts, MD

## 2017-12-25 NOTE — Patient Instructions (Signed)
DASH Eating Plan DASH stands for "Dietary Approaches to Stop Hypertension." The DASH eating plan is a healthy eating plan that has been shown to reduce high blood pressure (hypertension). It may also reduce your risk for type 2 diabetes, heart disease, and stroke. The DASH eating plan may also help with weight loss. What are tips for following this plan? General guidelines  Avoid eating more than 2,300 mg (milligrams) of salt (sodium) a day. If you have hypertension, you may need to reduce your sodium intake to 1,500 mg a day.  Limit alcohol intake to no more than 1 drink a day for nonpregnant women and 2 drinks a day for men. One drink equals 12 oz of beer, 5 oz of wine, or 1 oz of hard liquor.  Work with your health care provider to maintain a healthy body weight or to lose weight. Ask what an ideal weight is for you.  Get at least 30 minutes of exercise that causes your heart to beat faster (aerobic exercise) most days of the week. Activities may include walking, swimming, or biking.  Work with your health care provider or diet and nutrition specialist (dietitian) to adjust your eating plan to your individual calorie needs. Reading food labels  Check food labels for the amount of sodium per serving. Choose foods with less than 5 percent of the Daily Value of sodium. Generally, foods with less than 300 mg of sodium per serving fit into this eating plan.  To find whole grains, look for the word "whole" as the first word in the ingredient list. Shopping  Buy products labeled as "low-sodium" or "no salt added."  Buy fresh foods. Avoid canned foods and premade or frozen meals. Cooking  Avoid adding salt when cooking. Use salt-free seasonings or herbs instead of table salt or sea salt. Check with your health care provider or pharmacist before using salt substitutes.  Do not fry foods. Cook foods using healthy methods such as baking, boiling, grilling, and broiling instead.  Cook with  heart-healthy oils, such as olive, canola, soybean, or sunflower oil. Meal planning   Eat a balanced diet that includes: ? 5 or more servings of fruits and vegetables each day. At each meal, try to fill half of your plate with fruits and vegetables. ? Up to 6-8 servings of whole grains each day. ? Less than 6 oz of lean meat, poultry, or fish each day. A 3-oz serving of meat is about the same size as a deck of cards. One egg equals 1 oz. ? 2 servings of low-fat dairy each day. ? A serving of nuts, seeds, or beans 5 times each week. ? Heart-healthy fats. Healthy fats called Omega-3 fatty acids are found in foods such as flaxseeds and coldwater fish, like sardines, salmon, and mackerel.  Limit how much you eat of the following: ? Canned or prepackaged foods. ? Food that is high in trans fat, such as fried foods. ? Food that is high in saturated fat, such as fatty meat. ? Sweets, desserts, sugary drinks, and other foods with added sugar. ? Full-fat dairy products.  Do not salt foods before eating.  Try to eat at least 2 vegetarian meals each week.  Eat more home-cooked food and less restaurant, buffet, and fast food.  When eating at a restaurant, ask that your food be prepared with less salt or no salt, if possible. What foods are recommended? The items listed may not be a complete list. Talk with your dietitian about what   dietary choices are best for you. Grains Whole-grain or whole-wheat bread. Whole-grain or whole-wheat pasta. Brown rice. Oatmeal. Quinoa. Bulgur. Whole-grain and low-sodium cereals. Pita bread. Low-fat, low-sodium crackers. Whole-wheat flour tortillas. Vegetables Fresh or frozen vegetables (raw, steamed, roasted, or grilled). Low-sodium or reduced-sodium tomato and vegetable juice. Low-sodium or reduced-sodium tomato sauce and tomato paste. Low-sodium or reduced-sodium canned vegetables. Fruits All fresh, dried, or frozen fruit. Canned fruit in natural juice (without  added sugar). Meat and other protein foods Skinless chicken or turkey. Ground chicken or turkey. Pork with fat trimmed off. Fish and seafood. Egg whites. Dried beans, peas, or lentils. Unsalted nuts, nut butters, and seeds. Unsalted canned beans. Lean cuts of beef with fat trimmed off. Low-sodium, lean deli meat. Dairy Low-fat (1%) or fat-free (skim) milk. Fat-free, low-fat, or reduced-fat cheeses. Nonfat, low-sodium ricotta or cottage cheese. Low-fat or nonfat yogurt. Low-fat, low-sodium cheese. Fats and oils Soft margarine without trans fats. Vegetable oil. Low-fat, reduced-fat, or light mayonnaise and salad dressings (reduced-sodium). Canola, safflower, olive, soybean, and sunflower oils. Avocado. Seasoning and other foods Herbs. Spices. Seasoning mixes without salt. Unsalted popcorn and pretzels. Fat-free sweets. What foods are not recommended? The items listed may not be a complete list. Talk with your dietitian about what dietary choices are best for you. Grains Baked goods made with fat, such as croissants, muffins, or some breads. Dry pasta or rice meal packs. Vegetables Creamed or fried vegetables. Vegetables in a cheese sauce. Regular canned vegetables (not low-sodium or reduced-sodium). Regular canned tomato sauce and paste (not low-sodium or reduced-sodium). Regular tomato and vegetable juice (not low-sodium or reduced-sodium). Pickles. Olives. Fruits Canned fruit in a light or heavy syrup. Fried fruit. Fruit in cream or butter sauce. Meat and other protein foods Fatty cuts of meat. Ribs. Fried meat. Bacon. Sausage. Bologna and other processed lunch meats. Salami. Fatback. Hotdogs. Bratwurst. Salted nuts and seeds. Canned beans with added salt. Canned or smoked fish. Whole eggs or egg yolks. Chicken or turkey with skin. Dairy Whole or 2% milk, cream, and half-and-half. Whole or full-fat cream cheese. Whole-fat or sweetened yogurt. Full-fat cheese. Nondairy creamers. Whipped toppings.  Processed cheese and cheese spreads. Fats and oils Butter. Stick margarine. Lard. Shortening. Ghee. Bacon fat. Tropical oils, such as coconut, palm kernel, or palm oil. Seasoning and other foods Salted popcorn and pretzels. Onion salt, garlic salt, seasoned salt, table salt, and sea salt. Worcestershire sauce. Tartar sauce. Barbecue sauce. Teriyaki sauce. Soy sauce, including reduced-sodium. Steak sauce. Canned and packaged gravies. Fish sauce. Oyster sauce. Cocktail sauce. Horseradish that you find on the shelf. Ketchup. Mustard. Meat flavorings and tenderizers. Bouillon cubes. Hot sauce and Tabasco sauce. Premade or packaged marinades. Premade or packaged taco seasonings. Relishes. Regular salad dressings. Where to find more information:  National Heart, Lung, and Blood Institute: www.nhlbi.nih.gov  American Heart Association: www.heart.org Summary  The DASH eating plan is a healthy eating plan that has been shown to reduce high blood pressure (hypertension). It may also reduce your risk for type 2 diabetes, heart disease, and stroke.  With the DASH eating plan, you should limit salt (sodium) intake to 2,300 mg a day. If you have hypertension, you may need to reduce your sodium intake to 1,500 mg a day.  When on the DASH eating plan, aim to eat more fresh fruits and vegetables, whole grains, lean proteins, low-fat dairy, and heart-healthy fats.  Work with your health care provider or diet and nutrition specialist (dietitian) to adjust your eating plan to your individual   calorie needs. This information is not intended to replace advice given to you by your health care provider. Make sure you discuss any questions you have with your health care provider. Document Released: 08/28/2011 Document Revised: 09/01/2016 Document Reviewed: 09/01/2016 Elsevier Interactive Patient Education  2018 [employer].  Managing Your Hypertension Hypertension is commonly called high blood pressure. This is when  the force of your blood pressing against the walls of your arteries is too strong. Arteries are blood vessels that carry blood from your heart throughout your body. Hypertension forces the heart to work harder to pump blood, and may cause the arteries to become narrow or stiff. Having untreated or uncontrolled hypertension can cause heart attack, stroke, kidney disease, and other problems. What are blood pressure readings? A blood pressure reading consists of a higher number over a lower number. Ideally, your blood pressure should be below 120/80. The first ("top") number is called the systolic pressure. It is a measure of the pressure in your arteries as your heart beats. The second ("bottom") number is called the diastolic pressure. It is a measure of the pressure in your arteries as the heart relaxes. What does my blood pressure reading mean? Blood pressure is classified into four stages. Based on your blood pressure reading, your health care provider may use the following stages to determine what type of treatment you need, if any. Systolic pressure and diastolic pressure are measured in a unit called mm Hg. Normal  Systolic pressure: below 474.  Diastolic pressure: below 80. Elevated  Systolic pressure: 259-563.  Diastolic pressure: below 80. Hypertension stage 1  Systolic pressure: 875-643.  Diastolic pressure: 32-95. Hypertension stage 2  Systolic pressure: 188 or above.  Diastolic pressure: 90 or above. What health risks are associated with hypertension? Managing your hypertension is an important responsibility. Uncontrolled hypertension can lead to:  A heart attack.  A stroke.  A weakened blood vessel (aneurysm).  Heart failure.  Kidney damage.  Eye damage.  Metabolic syndrome.  Memory and concentration problems.  What changes can I make to manage my hypertension? Hypertension can be managed by making lifestyle changes and possibly by taking medicines. Your  health care provider will help you make a plan to bring your blood pressure within a normal range. Eating and drinking  Eat a diet that is high in fiber and potassium, and low in salt (sodium), added sugar, and fat. An example eating plan is called the DASH (Dietary Approaches to Stop Hypertension) diet. To eat this way: ? Eat plenty of fresh fruits and vegetables. Try to fill half of your plate at each meal with fruits and vegetables. ? Eat whole grains, such as whole wheat pasta, brown rice, or whole grain bread. Fill about one quarter of your plate with whole grains. ? Eat low-fat diary products. ? Avoid fatty cuts of meat, processed or cured meats, and poultry with skin. Fill about one quarter of your plate with lean proteins such as fish, chicken without skin, beans, eggs, and tofu. ? Avoid premade and processed foods. These tend to be higher in sodium, added sugar, and fat.  Reduce your daily sodium intake. Most people with hypertension should eat less than 1,500 mg of sodium a day.  Limit alcohol intake to no more than 1 drink a day for nonpregnant women and 2 drinks a day for men. One drink equals 12 oz of beer, 5 oz of wine, or 1 oz of hard liquor. Lifestyle  Work with your health care provider  to maintain a healthy body weight, or to lose weight. Ask what an ideal weight is for you.  Get at least 30 minutes of exercise that causes your heart to beat faster (aerobic exercise) most days of the week. Activities may include walking, swimming, or biking.  Include exercise to strengthen your muscles (resistance exercise), such as weight lifting, as part of your weekly exercise routine. Try to do these types of exercises for 30 minutes at least 3 days a week.  Do not use any products that contain nicotine or tobacco, such as cigarettes and e-cigarettes. If you need help quitting, ask your health care provider.  Control any long-term (chronic) conditions you have, such as high cholesterol  or diabetes. Monitoring  Monitor your blood pressure at home as told by your health care provider. Your personal target blood pressure may vary depending on your medical conditions, your age, and other factors.  Have your blood pressure checked regularly, as often as told by your health care provider. Working with your health care provider  Review all the medicines you take with your health care provider because there may be side effects or interactions.  Talk with your health care provider about your diet, exercise habits, and other lifestyle factors that may be contributing to hypertension.  Visit your health care provider regularly. Your health care provider can help you create and adjust your plan for managing hypertension. Will I need medicine to control my blood pressure? Your health care provider may prescribe medicine if lifestyle changes are not enough to get your blood pressure under control, and if:  Your systolic blood pressure is 130 or higher.  Your diastolic blood pressure is 80 or higher.  Take medicines only as told by your health care provider. Follow the directions carefully. Blood pressure medicines must be taken as prescribed. The medicine does not work as well when you skip doses. Skipping doses also puts you at risk for problems. Contact a health care provider if:  You think you are having a reaction to medicines you have taken.  You have repeated (recurrent) headaches.  You feel dizzy.  You have swelling in your ankles.  You have trouble with your vision. Get help right away if:  You develop a severe headache or confusion.  You have unusual weakness or numbness, or you feel faint.  You have severe pain in your chest or abdomen.  You vomit repeatedly.  You have trouble breathing. Summary  Hypertension is when the force of blood pumping through your arteries is too strong. If this condition is not controlled, it may put you at risk for serious  complications.  Your personal target blood pressure may vary depending on your medical conditions, your age, and other factors. For most people, a normal blood pressure is less than 120/80.  Hypertension is managed by lifestyle changes, medicines, or both. Lifestyle changes include weight loss, eating a healthy, low-sodium diet, exercising more, and limiting alcohol. This information is not intended to replace advice given to you by your health care provider. Make sure you discuss any questions you have with your health care provider. Document Released: 06/02/2012 Document Revised: 08/06/2016 Document Reviewed: 08/06/2016 Elsevier Interactive Patient Education  2018 [employer]  

## 2018-01-07 ENCOUNTER — Ambulatory Visit (INDEPENDENT_AMBULATORY_CARE_PROVIDER_SITE_OTHER): Payer: BLUE CROSS/BLUE SHIELD | Admitting: Family Medicine

## 2018-01-07 ENCOUNTER — Encounter: Payer: Self-pay | Admitting: Family Medicine

## 2018-01-07 VITALS — BP 140/82 | HR 108 | Temp 97.9°F | Wt 338.0 lb

## 2018-01-07 DIAGNOSIS — I10 Essential (primary) hypertension: Secondary | ICD-10-CM | POA: Diagnosis not present

## 2018-01-07 MED ORDER — LOSARTAN POTASSIUM 25 MG PO TABS: 25.0000 mg | ORAL_TABLET | Freq: Every day | ORAL | 3 refills | Status: DC

## 2018-01-07 NOTE — Progress Notes (Signed)
Subjective:    Patient ID: Laurie Peters, female    DOB: [birth year], 57 y.o.   MRN: [id number]  Chief Complaint  Patient presents with  . Hypertension    HPI Patient was seen today for follow-up on blood pressure.  Since last OFV patient states she has been checking her BP at home.  Is typically been 150s-170s/ 90s.  Patient states she is decreasing the salt in her diet, cut out sodas, drinking 3 bottles of water per day, and taking chlorthalidone 25 mg.  Patient endorses headaches and increased urination since last OFV.  Patient is unable to exercise currently 2/2 abnormal menses.  Patient has lost 11 pounds since last OFV.  Past Medical History:  Diagnosis Date  . Asthma   . Heart murmur   . Hypertension   . Uterine fibroids affecting pregnancy, antepartum     Allergies  Allergen Reactions  . Theraflu Cold & [Phenylephrine-Pheniramine-Dm] Hives    ROS General: Denies fever, chills, night sweats, changes in weight, changes in appetite HEENT: Denies ear pain, changes in vision, rhinorrhea, sore throat  +HA CV: Denies CP, palpitations, SOB, orthopnea Pulm: Denies SOB, cough, wheezing GI: Denies abdominal pain, nausea, vomiting, diarrhea, constipation GU: Denies dysuria, hematuria, frequency, vaginal discharge Msk: Denies muscle cramps, joint pains Neuro: Denies weakness, numbness, tingling Skin: Denies rashes, bruising Psych: Denies depression, anxiety, hallucinations     Objective:    Blood pressure 140/82, pulse (!) 108, temperature 97.9 F (36.6 C), temperature source Oral, weight (!) 338 lb (153.3 kg), SpO2 97 %.   Gen. Pleasant, well-nourished, in no distress, normal affect   HEENT: [place]/AT, face symmetric, no scleral icterus, PERRLA, nares patent without drainage, pharynx without erythema or exudate. Lungs: no accessory muscle use, CTAB, no wheezes or rales Cardiovascular: RRR, no m/r/g, no peripheral edema Abdomen: BS present, soft, NT/ND Neuro:  A&Ox3, CN II-XII  intact, normal gait Skin:  Warm, no lesions/ rash   Wt Readings from Last 3 Encounters:  01/07/18 (!) 338 lb (153.3 kg)  12/25/17 (!) 349 lb 8 oz (158.5 kg)  11/05/17 (!) 335 lb (152 kg)    Lab Results  Component Value Date   WBC 8.9 11/05/2017   HGB 14.7 11/05/2017   HCT 43.6 11/05/2017   PLT 481 (H) 11/05/2017   GLUCOSE 87 11/05/2017   ALT 232 (H) 06/15/2016   AST 128 (H) 06/15/2016   NA 140 11/05/2017   K 4.2 11/05/2017   CL 108 11/05/2017   CREATININE 0.78 11/05/2017   BUN 10 11/05/2017   CO2 23 11/05/2017    Assessment/Plan:  Essential hypertension  -elevated.  Initially 150/90.  Repeat 140/82 -pt to continue lifestyle modifications -continue checking bp at home -Congratulated on 11 pounds weight loss -continue chlorthalidone 25 mg daily - Plan: losartan (COZAAR) 25 MG tablet  F/u in 1 months, sooner if needed.  Check bmp at next OFV.  Grier Mitts, MD

## 2018-01-07 NOTE — Patient Instructions (Signed)
Managing Your Hypertension Hypertension is commonly called high blood pressure. This is when the force of your blood pressing against the walls of your arteries is too strong. Arteries are blood vessels that carry blood from your heart throughout your body. Hypertension forces the heart to work harder to pump blood, and may cause the arteries to become narrow or stiff. Having untreated or uncontrolled hypertension can cause heart attack, stroke, kidney disease, and other problems. What are blood pressure readings? A blood pressure reading consists of a higher number over a lower number. Ideally, your blood pressure should be below 120/80. The first ("top") number is called the systolic pressure. It is a measure of the pressure in your arteries as your heart beats. The second ("bottom") number is called the diastolic pressure. It is a measure of the pressure in your arteries as the heart relaxes. What does my blood pressure reading mean? Blood pressure is classified into four stages. Based on your blood pressure reading, your health care provider may use the following stages to determine what type of treatment you need, if any. Systolic pressure and diastolic pressure are measured in a unit called mm Hg. Normal  Systolic pressure: below 120.  Diastolic pressure: below 80. Elevated  Systolic pressure: 120-129.  Diastolic pressure: below 80. Hypertension stage 1  Systolic pressure: 130-139.  Diastolic pressure: 80-89. Hypertension stage 2  Systolic pressure: 140 or above.  Diastolic pressure: 90 or above. What health risks are associated with hypertension? Managing your hypertension is an important responsibility. Uncontrolled hypertension can lead to:  A heart attack.  A stroke.  A weakened blood vessel (aneurysm).  Heart failure.  Kidney damage.  Eye damage.  Metabolic syndrome.  Memory and concentration problems.  What changes can I make to manage my  hypertension? Hypertension can be managed by making lifestyle changes and possibly by taking medicines. Your health care provider will help you make a plan to bring your blood pressure within a normal range. Eating and drinking  Eat a diet that is high in fiber and potassium, and low in salt (sodium), added sugar, and fat. An example eating plan is called the DASH (Dietary Approaches to Stop Hypertension) diet. To eat this way: ? Eat plenty of fresh fruits and vegetables. Try to fill half of your plate at each meal with fruits and vegetables. ? Eat whole grains, such as whole wheat pasta, brown rice, or whole grain bread. Fill about one quarter of your plate with whole grains. ? Eat low-fat diary products. ? Avoid fatty cuts of meat, processed or cured meats, and poultry with skin. Fill about one quarter of your plate with lean proteins such as fish, chicken without skin, beans, eggs, and tofu. ? Avoid premade and processed foods. These tend to be higher in sodium, added sugar, and fat.  Reduce your daily sodium intake. Most people with hypertension should eat less than 1,500 mg of sodium a day.  Limit alcohol intake to no more than 1 drink a day for nonpregnant women and 2 drinks a day for men. One drink equals 12 oz of beer, 5 oz of wine, or 1 oz of hard liquor. Lifestyle  Work with your health care provider to maintain a healthy body weight, or to lose weight. Ask what an ideal weight is for you.  Get at least 30 minutes of exercise that causes your heart to beat faster (aerobic exercise) most days of the week. Activities may include walking, swimming, or biking.  Include exercise   to strengthen your muscles (resistance exercise), such as weight lifting, as part of your weekly exercise routine. Try to do these types of exercises for 30 minutes at least 3 days a week.  Do not use any products that contain nicotine or tobacco, such as cigarettes and e-cigarettes. If you need help quitting, ask  your health care provider.  Control any long-term (chronic) conditions you have, such as high cholesterol or diabetes. Monitoring  Monitor your blood pressure at home as told by your health care provider. Your personal target blood pressure may vary depending on your medical conditions, your age, and other factors.  Have your blood pressure checked regularly, as often as told by your health care provider. Working with your health care provider  Review all the medicines you take with your health care provider because there may be side effects or interactions.  Talk with your health care provider about your diet, exercise habits, and other lifestyle factors that may be contributing to hypertension.  Visit your health care provider regularly. Your health care provider can help you create and adjust your plan for managing hypertension. Will I need medicine to control my blood pressure? Your health care provider may prescribe medicine if lifestyle changes are not enough to get your blood pressure under control, and if:  Your systolic blood pressure is 130 or higher.  Your diastolic blood pressure is 80 or higher.  Take medicines only as told by your health care provider. Follow the directions carefully. Blood pressure medicines must be taken as prescribed. The medicine does not work as well when you skip doses. Skipping doses also puts you at risk for problems. Contact a health care provider if:  You think you are having a reaction to medicines you have taken.  You have repeated (recurrent) headaches.  You feel dizzy.  You have swelling in your ankles.  You have trouble with your vision. Get help right away if:  You develop a severe headache or confusion.  You have unusual weakness or numbness, or you feel faint.  You have severe pain in your chest or abdomen.  You vomit repeatedly.  You have trouble breathing. Summary  Hypertension is when the force of blood pumping through  your arteries is too strong. If this condition is not controlled, it may put you at risk for serious complications.  Your personal target blood pressure may vary depending on your medical conditions, your age, and other factors. For most people, a normal blood pressure is less than 120/80.  Hypertension is managed by lifestyle changes, medicines, or both. Lifestyle changes include weight loss, eating a healthy, low-sodium diet, exercising more, and limiting alcohol. This information is not intended to replace advice given to you by your health care provider. Make sure you discuss any questions you have with your health care provider. Document Released: 06/02/2012 Document Revised: 08/06/2016 Document Reviewed: 08/06/2016 Elsevier Interactive Patient Education  2018 Elsevier Inc.  

## 2018-03-23 ENCOUNTER — Encounter: Payer: Self-pay | Admitting: Family Medicine

## 2018-03-23 ENCOUNTER — Ambulatory Visit (INDEPENDENT_AMBULATORY_CARE_PROVIDER_SITE_OTHER): Payer: BLUE CROSS/BLUE SHIELD | Admitting: Family Medicine

## 2018-03-23 VITALS — BP 128/86 | HR 94 | Temp 98.8°F | Wt 335.0 lb

## 2018-03-23 DIAGNOSIS — I10 Essential (primary) hypertension: Secondary | ICD-10-CM | POA: Diagnosis not present

## 2018-03-23 LAB — BASIC METABOLIC PANEL
BUN: 9 mg/dL (ref 6–23)
CALCIUM: 9.5 mg/dL (ref 8.4–10.5)
CO2: 28 mEq/L (ref 19–32)
CREATININE: 0.74 mg/dL (ref 0.40–1.20)
Chloride: 101 mEq/L (ref 96–112)
GFR: 104.66 mL/min (ref >60.00)
Glucose, Bld: 80 mg/dL (ref 70–99)
Potassium: 3.7 mEq/L (ref 3.5–5.1)
SODIUM: 137 meq/L (ref 135–145)

## 2018-03-23 NOTE — Progress Notes (Signed)
Subjective:    Patient ID: Laurie Peters, female    DOB: [birth year], 57 y.o.   MRN: [id number]  No chief complaint on file.   HPI Patient was seen today for follow-up on BP.  Pt has not been taking losartan 25 mg 1 week.  States she was having muscle cramps and thought it was 2/2 the losartan.  Pt states the cramps have resolved since d/c'ing the med.  Pt still taking chlorthalidone 25 mg daily.  States bp has been 131-135/81-87.  Pt endorses HA this afternoon, but notes she got her hair braided this morning.  Past Medical History:  Diagnosis Date  . Asthma   . Heart murmur   . Hypertension   . Uterine fibroids affecting pregnancy, antepartum     Allergies  Allergen Reactions  . Theraflu Cold & [Phenylephrine-Pheniramine-Dm] Hives    ROS General: Denies fever, chills, night sweats, changes in weight, changes in appetite HEENT: Denies ear pain, changes in vision, rhinorrhea, sore throat  +HA CV: Denies CP, palpitations, SOB, orthopnea Pulm: Denies SOB, cough, wheezing GI: Denies abdominal pain, nausea, vomiting, diarrhea, constipation GU: Denies dysuria, hematuria, frequency, vaginal discharge Msk: Denies joint pains  +muscle cramps--now resolved Neuro: Denies weakness, numbness, tingling Skin: Denies rashes, bruising Psych: Denies depression, anxiety, hallucinations     Objective:    Blood pressure 128/86, pulse 94, temperature 98.8 F (37.1 C), temperature source Oral, weight (!) 335 lb (152 kg), SpO2 98 %.   Gen. Pleasant, well-nourished, in no distress, normal affect   HEENT: [place]/AT, face symmetric, no scleral icterus, PERRLA, nares patent without drainage Lungs: no accessory muscle use, CTAB, no wheezes or rales Cardiovascular: RRR, no m/r/g, no peripheral edema Neuro:  A&Ox3, CN II-XII intact, normal gait  Wt Readings from Last 3 Encounters:  03/23/18 (!) 335 lb (152 kg)  01/07/18 (!) 338 lb (153.3 kg)  12/25/17 (!) 349 lb 8 oz (158.5 kg)    Lab Results   Component Value Date   WBC 8.9 11/05/2017   HGB 14.7 11/05/2017   HCT 43.6 11/05/2017   PLT 481 (H) 11/05/2017   GLUCOSE 87 11/05/2017   ALT 232 (H) 06/15/2016   AST 128 (H) 06/15/2016   NA 140 11/05/2017   K 4.2 11/05/2017   CL 108 11/05/2017   CREATININE 0.78 11/05/2017   BUN 10 11/05/2017   CO2 23 11/05/2017    Assessment/Plan:  Essential hypertension  -recheck bp -discussed chlorthalidone more likely to cause muscle cramps than losartan. -since cramps resolved will continue to monitor bp on chlorthalidone 25 mg -continue lifestyle modifications -f/u in 1 month, sooner if needed.  Advised to bring bp log to the visit. -BMP ordered  Grier Mitts, MD

## 2018-04-07 ENCOUNTER — Telehealth: Payer: Self-pay | Admitting: *Deleted

## 2018-04-07 NOTE — Telephone Encounter (Signed)
Result note read to patient; verbalizes understanding.  Telephone encounter as result note was not routed to [facility]. 

## 2018-04-13 ENCOUNTER — Telehealth: Payer: Self-pay | Admitting: Family Medicine

## 2018-04-13 NOTE — Telephone Encounter (Signed)
Called pt and was unable to leave a VM due to mailbox not being set up yet.

## 2018-04-13 NOTE — Telephone Encounter (Signed)
Copied from [place] [id number]. Topic: Quick Communication - See Telephone Encounter >> Apr 13, 2018 11:15 AM Antonieta Iba C wrote: CRM for notification. See Telephone encounter for: 04/13/18.  Pt returned CMA call about results. Pt has already received her results.

## 2018-04-14 NOTE — Telephone Encounter (Signed)
Called pt and unable to leave a VM to call back due to pt's voice mail box not being set up.

## 2018-05-07 ENCOUNTER — Encounter: Payer: Self-pay | Admitting: Family Medicine

## 2018-05-07 ENCOUNTER — Ambulatory Visit (INDEPENDENT_AMBULATORY_CARE_PROVIDER_SITE_OTHER): Payer: BLUE CROSS/BLUE SHIELD | Admitting: Family Medicine

## 2018-05-07 VITALS — BP 130/78 | HR 88 | Temp 98.5°F | Wt 340.0 lb

## 2018-05-07 DIAGNOSIS — I10 Essential (primary) hypertension: Secondary | ICD-10-CM

## 2018-05-07 MED ORDER — CHLORTHALIDONE 25 MG PO TABS: 25.0000 mg | ORAL_TABLET | Freq: Every day | ORAL | 3 refills | Status: DC

## 2018-05-07 NOTE — Patient Instructions (Signed)
Managing Your Hypertension Hypertension is commonly called high blood pressure. This is when the force of your blood pressing against the walls of your arteries is too strong. Arteries are blood vessels that carry blood from your heart throughout your body. Hypertension forces the heart to work harder to pump blood, and may cause the arteries to become narrow or stiff. Having untreated or uncontrolled hypertension can cause heart attack, stroke, kidney disease, and other problems. What are blood pressure readings? A blood pressure reading consists of a higher number over a lower number. Ideally, your blood pressure should be below 120/80. The first ("top") number is called the systolic pressure. It is a measure of the pressure in your arteries as your heart beats. The second ("bottom") number is called the diastolic pressure. It is a measure of the pressure in your arteries as the heart relaxes. What does my blood pressure reading mean? Blood pressure is classified into four stages. Based on your blood pressure reading, your health care provider may use the following stages to determine what type of treatment you need, if any. Systolic pressure and diastolic pressure are measured in a unit called mm Hg. Normal  Systolic pressure: below 120.  Diastolic pressure: below 80. Elevated  Systolic pressure: 120-129.  Diastolic pressure: below 80. Hypertension stage 1  Systolic pressure: 130-139.  Diastolic pressure: 80-89. Hypertension stage 2  Systolic pressure: 140 or above.  Diastolic pressure: 90 or above. What health risks are associated with hypertension? Managing your hypertension is an important responsibility. Uncontrolled hypertension can lead to:  A heart attack.  A stroke.  A weakened blood vessel (aneurysm).  Heart failure.  Kidney damage.  Eye damage.  Metabolic syndrome.  Memory and concentration problems.  What changes can I make to manage my  hypertension? Hypertension can be managed by making lifestyle changes and possibly by taking medicines. Your health care provider will help you make a plan to bring your blood pressure within a normal range. Eating and drinking  Eat a diet that is high in fiber and potassium, and low in salt (sodium), added sugar, and fat. An example eating plan is called the DASH (Dietary Approaches to Stop Hypertension) diet. To eat this way: ? Eat plenty of fresh fruits and vegetables. Try to fill half of your plate at each meal with fruits and vegetables. ? Eat whole grains, such as whole wheat pasta, brown rice, or whole grain bread. Fill about one quarter of your plate with whole grains. ? Eat low-fat diary products. ? Avoid fatty cuts of meat, processed or cured meats, and poultry with skin. Fill about one quarter of your plate with lean proteins such as fish, chicken without skin, beans, eggs, and tofu. ? Avoid premade and processed foods. These tend to be higher in sodium, added sugar, and fat.  Reduce your daily sodium intake. Most people with hypertension should eat less than 1,500 mg of sodium a day.  Limit alcohol intake to no more than 1 drink a day for nonpregnant women and 2 drinks a day for men. One drink equals 12 oz of beer, 5 oz of wine, or 1 oz of hard liquor. Lifestyle  Work with your health care provider to maintain a healthy body weight, or to lose weight. Ask what an ideal weight is for you.  Get at least 30 minutes of exercise that causes your heart to beat faster (aerobic exercise) most days of the week. Activities may include walking, swimming, or biking.  Include exercise   to strengthen your muscles (resistance exercise), such as weight lifting, as part of your weekly exercise routine. Try to do these types of exercises for 30 minutes at least 3 days a week.  Do not use any products that contain nicotine or tobacco, such as cigarettes and e-cigarettes. If you need help quitting, ask  your health care provider.  Control any long-term (chronic) conditions you have, such as high cholesterol or diabetes. Monitoring  Monitor your blood pressure at home as told by your health care provider. Your personal target blood pressure may vary depending on your medical conditions, your age, and other factors.  Have your blood pressure checked regularly, as often as told by your health care provider. Working with your health care provider  Review all the medicines you take with your health care provider because there may be side effects or interactions.  Talk with your health care provider about your diet, exercise habits, and other lifestyle factors that may be contributing to hypertension.  Visit your health care provider regularly. Your health care provider can help you create and adjust your plan for managing hypertension. Will I need medicine to control my blood pressure? Your health care provider may prescribe medicine if lifestyle changes are not enough to get your blood pressure under control, and if:  Your systolic blood pressure is 130 or higher.  Your diastolic blood pressure is 80 or higher.  Take medicines only as told by your health care provider. Follow the directions carefully. Blood pressure medicines must be taken as prescribed. The medicine does not work as well when you skip doses. Skipping doses also puts you at risk for problems. Contact a health care provider if:  You think you are having a reaction to medicines you have taken.  You have repeated (recurrent) headaches.  You feel dizzy.  You have swelling in your ankles.  You have trouble with your vision. Get help right away if:  You develop a severe headache or confusion.  You have unusual weakness or numbness, or you feel faint.  You have severe pain in your chest or abdomen.  You vomit repeatedly.  You have trouble breathing. Summary  Hypertension is when the force of blood pumping through  your arteries is too strong. If this condition is not controlled, it may put you at risk for serious complications.  Your personal target blood pressure may vary depending on your medical conditions, your age, and other factors. For most people, a normal blood pressure is less than 120/80.  Hypertension is managed by lifestyle changes, medicines, or both. Lifestyle changes include weight loss, eating a healthy, low-sodium diet, exercising more, and limiting alcohol. This information is not intended to replace advice given to you by your health care provider. Make sure you discuss any questions you have with your health care provider. Document Released: 06/02/2012 Document Revised: 08/06/2016 Document Reviewed: 08/06/2016 Elsevier Interactive Patient Education  2018 Elsevier Inc.  

## 2018-05-07 NOTE — Progress Notes (Signed)
Subjective:    Patient ID: Laurie Peters, female    DOB: [birth year], 57 y.o.   MRN: [id number]  No chief complaint on file.   HPI Patient was seen today for follow-up on BP.  Pt has been taking hydrochlorothiazide 12.5 mg daily.  Pt states she forgot to take her medicine on Monday and has been trying to get her blood pressure under control.  Dull headache, resolved yesterday.  Pt denies changes in vision.  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  Pt also notes she has not been eating as well as she should.  Past Medical History:  Diagnosis Date  . Asthma   . Heart murmur   . Hypertension   . Uterine fibroids affecting pregnancy, antepartum     Allergies  Allergen Reactions  . Theraflu Cold & [Phenylephrine-Pheniramine-Dm] Hives    ROS General: Denies fever, chills, night sweats, changes in weight, changes in appetite HEENT: Denies headaches, ear pain, changes in vision, rhinorrhea, sore throat CV: Denies CP, palpitations, SOB, orthopnea Pulm: Denies SOB, cough, wheezing GI: Denies abdominal pain, nausea, vomiting, diarrhea, constipation GU: Denies dysuria, hematuria, frequency, vaginal discharge Msk: Denies muscle cramps, joint pains Neuro: Denies weakness, numbness, tingling Skin: Denies rashes, bruising Psych: Denies depression, anxiety, hallucinations     Objective:    Blood pressure 130/78, pulse 88, temperature 98.5 F (36.9 C), temperature source Oral, weight (!) 340 lb (154.2 kg), SpO2 98 %.   Gen. Pleasant, well-nourished, in no distress, normal affect   HEENT: [place]/AT, face symmetric, no scleral icterus, PERRLA, nares patent without drainage. Lungs: no accessory muscle use, CTAB, no wheezes or rales Cardiovascular: RRR, no m/r/g, no peripheral edema Neuro:  A&Ox3, CN II-XII intact, normal gait  Wt Readings from Last 3 Encounters:  05/07/18 (!) 340 lb (154.2 kg)  03/23/18 (!)  335 lb (152 kg)  01/07/18 (!) 338 lb (153.3 kg)    Lab Results  Component Value Date   WBC 8.9 11/05/2017   HGB 14.7 11/05/2017   HCT 43.6 11/05/2017   PLT 481 (H) 11/05/2017   GLUCOSE 80 03/23/2018   ALT 232 (H) 06/15/2016   AST 128 (H) 06/15/2016   NA 137 03/23/2018   K 3.7 03/23/2018   CL 101 03/23/2018   CREATININE 0.74 03/23/2018   BUN 9 03/23/2018   CO2 28 03/23/2018    Assessment/Plan:  Essential hypertension  -improving.  Anticipate continued improvement with restarting lifestyle modifications -weight loss encouraged - Plan: chlorthalidone (HYGROTON) 25 MG tablet -continue checking bp at home.  If >140/90 advised to notify clinic. -f/u in 1-2 months  Grier Mitts, MD

## 2018-05-27 ENCOUNTER — Ambulatory Visit: Admit: 2018-05-27 | Payer: BLUE CROSS/BLUE SHIELD | Admitting: Obstetrics & Gynecology

## 2018-05-27 SURGERY — DILATATION & CURETTAGE/HYSTEROSCOPY WITH MYOSURE
Anesthesia: Choice

## 2018-06-04 ENCOUNTER — Telehealth: Payer: Self-pay | Admitting: Family Medicine

## 2018-06-04 NOTE — Telephone Encounter (Signed)
Albuterol inhaler refill Last Refilled by a historical provider Last OV: 05/07/18 PCP: Dr. Volanda Napoleon Pharmacy: [facility]   [address]

## 2018-06-04 NOTE — Telephone Encounter (Signed)
Copied from [place] [id number]. Topic: Quick Communication - Rx Refill/Question >> Jun 04, 2018  3:53 PM Jarold Motto, Fraser Din wrote: Medication: albuterol (PROAIR HFA) 108 (90 Base) MCG/ACT inhaler [[id number]]   Has the patient contacted their pharmacy?  yesPreferred Pharmacy (with phone number or street name): [place], [place] - [address] [phone] (Phone) [phone] (Fax)    Agent: Please be advised that RX refills may take up to 3 business days. We ask that you follow-up with your pharmacy.

## 2018-06-07 ENCOUNTER — Other Ambulatory Visit: Payer: Self-pay

## 2018-06-07 MED ORDER — ALBUTEROL SULFATE HFA 108 (90 BASE) MCG/ACT IN AERS: 1.0000 | INHALATION_SPRAY | Freq: Four times a day (QID) | RESPIRATORY_TRACT | 0 refills | Status: DC | PRN

## 2018-06-07 NOTE — Telephone Encounter (Signed)
Rx refill was sent to pharmacy. 

## 2018-06-16 ENCOUNTER — Other Ambulatory Visit: Payer: Self-pay | Admitting: Obstetrics and Gynecology

## 2018-06-16 DIAGNOSIS — Z862 Personal history of diseases of the blood and blood-forming organs and certain disorders involving the immune mechanism: Secondary | ICD-10-CM

## 2018-06-16 DIAGNOSIS — D219 Benign neoplasm of connective and other soft tissue, unspecified: Secondary | ICD-10-CM

## 2018-06-16 DIAGNOSIS — N920 Excessive and frequent menstruation with regular cycle: Secondary | ICD-10-CM

## 2018-06-16 NOTE — H&P (Addendum)
Laurie Peters is a 57 yo female, P: 4-0-2-4 who presents for hysteroscopy, dilatation, curettage and endometrial ablation because of menorrhagia and a history of severe anemia .  The  patient gives a history , that two years ago she endured  a month of prolonged and heavy bleeding that culminated in her passing out and having to receive 3 units of packed red blood cells for a hemoglobin of 6.2.  At that time she was placed on Norethindrone 10 mg daily and has not had any bleeding since that time,  except when she misses a pill or has increased  physical activity.   In the past her "typical"  menstrual flow would last for 5  days with the need to change her pad 7 times a day due to large clots.  Fortunately her   cramping was described as minimal. .  A pelvic ultrasound in 2017 showed a uterus: 13 x 10 x 9.0  cm, endometrium: 7.2 mm with  #5 fibroids: 4.4 cm, 2.4 cm, 2.1 cm, 4.3 cm and 3.7 cm.  The right ovary measured 2.6 cm and the  left ovary- 4.2 cm.  An endometrial biopsy performed  December 25, 2017  showed features of  an endometrial polyp with no atypia or malignancy.  She denies any changes in bowel or bladder function and is abstinent.  The patient was given a review of both the  medical and surgical management options for her bleeding condition. After further consideration and given the chronicity of her bleeding issue the patient has decided to proceed with surgical management in the form or hysteroscopy, dilatation, curettage and endometrial ablation.     Past Medical History  OB History: G: 6; P: 4-0-2-4;  SVB: 1984, 1985, 1991 and 1996  GYN History: menarche: 57 YO     LMP: 2017 (see HPI)   Contracepton: Abstinence    Has a remote history of chlamydia and an abnormal PAP Smear (1992) but  PAP Smears have  been normal since. No cervical therapy was necessary..   Last PAP smear: 2019-normal  Medical History: Anemia, Abnormal Liver Labs,  Abnormal Thyroid Labs,  Hypertension, Asthma, Heart Murmur,  Vitamin D Deficiency and Endometrial Polyp  Surgical History:  2017  Chlolecystectomy Denies problems with anesthesia or history of blood transfusions  Family History:  Heart Disease, Asthma, Hyperlipidemia, Hypertension, Uterine Cancer, Diabetes Mellitus and Uterine Cancer.  Social History:   Divorced and employed in Therapist, art;  Denies tobacco use and   Medications: Losartan 25 mg daily Albuterol Inhaler Nebulizer Chlorthalidone 25 mg daily Norethindrone 10 mg  daily ProAir HFA 90 mcg/actuation inhaler daily  No Known Allergies   Denies sensitivity to peanuts, shellfish, soy, latex or adhesives.    ROS: Admits to glasses, and right ear tinnitus but denies headache, vision changes, nasal congestion, dysphagia, tinnitus, dizziness, hoarseness, cough,  chest pain, shortness of breath, nausea, vomiting, diarrhea,constipation,  urinary frequency, urgency  dysuria, hematuria, vaginitis symptoms, pelvic pain, swelling of joints,easy bruising,  myalgias, arthralgias, skin rashes, unexplained weight loss and except as is mentioned in the history of present illness, patient's review of systems is otherwise negative.     Physical Exam  Bp: 142/92  P: 107 bpm  R: 16  Temperature: 98.3 degrees F orally  Weight: 336 lbs.  Height: 5\' 5"   BMI: 54.2    O2Sat.: 99% (room air)  Neck: supple without masses or thyromegaly Lungs: clear to auscultation Heart: regular rate and rhythm Abdomen: soft, non-tender and  no organomegaly Pelvic:EGBUS- wnl; vagina-normal rugae; uterus-normal size (exam limited by habitus), cervix without lesions or motion tenderness; adnexae-no tenderness or masses Extremities:  no clubbing, cyanosis or edema   Assesment: Menorrhagia                      Fibroids                      Anemia  Disposition:  A discussion was held with patient regarding the indication for her procedure(s) along with the risks, which include but are not limited to: reaction to anesthesia,  damage to adjacent organs, infection and excessive bleeding.  The patient verbalized understanding of these risks and has consented to proceed with a Hysteroscopy, Dilatation, Curettage and Hydrothermal Ablation at [place] on July 08, 2018.   Laurie J. Florene Glen, PA-C for Dr. Harvie Bridge. Mancel Bale  Risks/Benefits/Alternatives discussed with the patient previously and consent to be signed and witnessed.

## 2018-06-18 NOTE — Patient Instructions (Addendum)
Your procedure is scheduled on: Thursday, Oct 17  Enter through the Main Entrance of [facility] at: 7:30 am  Pick up the phone at the desk and dial [phone].  Call this number if you have problems the morning of surgery: [phone].  Remember: Do NOT eat food or Do NOT drink clear liquids (including water) after midnight Wednesday  Take these medicines the morning of surgery with a SIP OF WATER: chlorthalidone  Brush your teeth on the day of surgery.  Bring albuterol inhaler with you on day of surgery.  Stop herbal medications, vitamin supplements, Ibuprofen/NSAIDS at this time.  Do NOT wear jewelry (body piercing), metal hair clips/bobby pins, make-up, or nail polish. Do NOT wear lotions, powders, or perfumes.  You may wear deoderant. Do NOT shave for 48 hours prior to surgery. Do NOT bring valuables to the hospital.  Have a responsible adult drive you home and stay with you for 24 hours after your procedure.  Home with Laurie Peters cell [phone] or Laurie Peters cell [phone].

## 2018-06-21 ENCOUNTER — Telehealth: Payer: Self-pay | Admitting: Family Medicine

## 2018-06-21 NOTE — Telephone Encounter (Signed)
Copied from [place] [phone]. Topic: Quick Communication - See Telephone Encounter >> Jun 21, 2018  1:00 PM Blase Mess A wrote: CRM for notification. See Telephone encounter for: 06/21/18. Adrienne From [facility] is align for request for Medical Clearance Procedure  is scheduled 07/08/18.  Please advise [phone].  Thanks

## 2018-06-22 NOTE — Telephone Encounter (Signed)
[facility] Fax number is [phone]

## 2018-06-22 NOTE — Telephone Encounter (Signed)
[facility] is requesting a letter stating if there would be any health contraindications with pt being under general Anesthetic for a procedure of Endometriosis scheduled on 07/08/2018. Please advise

## 2018-06-25 ENCOUNTER — Other Ambulatory Visit: Payer: Self-pay

## 2018-06-25 ENCOUNTER — Encounter (HOSPITAL_COMMUNITY): Payer: Self-pay

## 2018-06-25 ENCOUNTER — Encounter (HOSPITAL_COMMUNITY)
Admission: RE | Admit: 2018-06-25 | Discharge: 2018-06-25 | Disposition: A | Discharge status: Home / Self Care | Payer: BLUE CROSS/BLUE SHIELD | Source: Ambulatory Visit | Attending: Obstetrics and Gynecology | Admitting: Obstetrics and Gynecology

## 2018-06-25 DIAGNOSIS — Z01812 Encounter for preprocedural laboratory examination: Secondary | ICD-10-CM | POA: Diagnosis not present

## 2018-06-25 DIAGNOSIS — N920 Excessive and frequent menstruation with regular cycle: Secondary | ICD-10-CM | POA: Insufficient documentation

## 2018-06-25 HISTORY — DX: Personal history of other medical treatment: Z92.89

## 2018-06-25 HISTORY — DX: Encounter for elective termination of pregnancy: Z33.2

## 2018-06-25 LAB — BASIC METABOLIC PANEL
Anion gap: 9 (ref 5–15)
BUN: 9 mg/dL (ref 6–20)
CALCIUM: 9 mg/dL (ref 8.9–10.3)
CHLORIDE: 104 mmol/L (ref 98–111)
CO2: 26 mmol/L (ref 22–32)
Creatinine, Ser: 0.82 mg/dL (ref 0.44–1.00)
GFR calc Af Amer: >60 mL/min (ref >60)
GFR calc non Af Amer: >60 mL/min (ref >60)
Glucose, Bld: 94 mg/dL (ref 70–99)
Potassium: 3.7 mmol/L (ref 3.5–5.1)
Sodium: 139 mmol/L (ref 135–145)

## 2018-06-25 LAB — CBC
HEMATOCRIT: 42.9 % (ref 36.0–46.0)
HEMOGLOBIN: 14 g/dL (ref 12.0–15.0)
MCH: 31.4 pg (ref 26.0–34.0)
MCHC: 32.6 g/dL (ref 30.0–36.0)
MCV: 96.2 fL (ref 78.0–100.0)
Platelets: 460 10*3/uL — ABNORMAL HIGH (ref 150–400)
RBC: 4.46 MIL/uL (ref 3.87–5.11)
RDW: 13.7 % (ref 11.5–15.5)
WBC: 6.8 10*3/uL (ref 4.0–10.5)

## 2018-06-28 ENCOUNTER — Encounter: Payer: Self-pay | Admitting: Family Medicine

## 2018-06-28 NOTE — Telephone Encounter (Signed)
Letter printed.

## 2018-06-28 NOTE — Telephone Encounter (Signed)
Letter was printed, faxed and a confirmation received.

## 2018-07-07 ENCOUNTER — Telehealth: Payer: Self-pay

## 2018-07-07 NOTE — Telephone Encounter (Signed)
Pt medical records clearance/ letter was faxed to [facility] as requested.

## 2018-07-07 NOTE — Anesthesia Preprocedure Evaluation (Addendum)
Anesthesia Evaluation  Patient identified by MRN, date of birth, ID band Patient awake    Reviewed: Allergy & Precautions, NPO status , Patient's Chart, lab work & pertinent test results  History of Anesthesia Complications Negative for: history of anesthetic complications  Airway Mallampati: III  TM Distance: >3 FB Neck ROM: Full    Dental  (+) Loose, Teeth Intact, Dental Advisory Given,    Pulmonary asthma ,    Pulmonary exam normal breath sounds clear to auscultation       Cardiovascular hypertension, Pt. on medications Normal cardiovascular exam Rhythm:Regular Rate:Normal     Neuro/Psych negative neurological ROS  negative psych ROS   GI/Hepatic negative GI ROS, Neg liver ROS,   Endo/Other  Morbid obesity (BMI 56)  Renal/GU negative Renal ROS     Musculoskeletal negative musculoskeletal ROS (+)   Abdominal   Peds  Hematology negative hematology ROS (+)   Anesthesia Other Findings Day of surgery medications reviewed with the patient.  Reproductive/Obstetrics                            Anesthesia Physical Anesthesia Plan  ASA: III  Anesthesia Plan: General   Post-op Pain Management:    Induction: Intravenous  PONV Risk Score and Plan: 3 and Ondansetron, Dexamethasone, Treatment may vary due to age or medical condition and Scopolamine patch - Pre-op  Airway Management Planned: Oral ETT and Video Laryngoscope Planned  Additional Equipment:   Intra-op Plan:   Post-operative Plan: Extubation in OR  Informed Consent: I have reviewed the patients History and Physical, chart, labs and discussed the procedure including the risks, benefits and alternatives for the proposed anesthesia with the patient or authorized representative who has indicated his/her understanding and acceptance.   Dental advisory given  Plan Discussed with: CRNA and Surgeon  Anesthesia Plan Comments:         Anesthesia Quick Evaluation

## 2018-07-08 ENCOUNTER — Ambulatory Visit (HOSPITAL_COMMUNITY): Payer: BLUE CROSS/BLUE SHIELD | Admitting: Anesthesiology

## 2018-07-08 ENCOUNTER — Encounter (HOSPITAL_COMMUNITY): Payer: Self-pay | Admitting: Emergency Medicine

## 2018-07-08 ENCOUNTER — Ambulatory Visit (HOSPITAL_COMMUNITY)
Admission: AD | Admit: 2018-07-08 | Discharge: 2018-07-08 | Disposition: A | Discharge status: Home / Self Care | Payer: BLUE CROSS/BLUE SHIELD | Source: Ambulatory Visit | Attending: Obstetrics and Gynecology | Admitting: Obstetrics and Gynecology

## 2018-07-08 ENCOUNTER — Encounter (HOSPITAL_COMMUNITY)
Admission: AD | Disposition: A | Discharge status: Home / Self Care | Payer: Self-pay | Source: Ambulatory Visit | Attending: Obstetrics and Gynecology

## 2018-07-08 DIAGNOSIS — D259 Leiomyoma of uterus, unspecified: Secondary | ICD-10-CM | POA: Insufficient documentation

## 2018-07-08 DIAGNOSIS — N840 Polyp of corpus uteri: Secondary | ICD-10-CM | POA: Diagnosis not present

## 2018-07-08 DIAGNOSIS — Z79899 Other long term (current) drug therapy: Secondary | ICD-10-CM | POA: Insufficient documentation

## 2018-07-08 DIAGNOSIS — I10 Essential (primary) hypertension: Secondary | ICD-10-CM | POA: Insufficient documentation

## 2018-07-08 DIAGNOSIS — D219 Benign neoplasm of connective and other soft tissue, unspecified: Secondary | ICD-10-CM

## 2018-07-08 DIAGNOSIS — N920 Excessive and frequent menstruation with regular cycle: Secondary | ICD-10-CM | POA: Insufficient documentation

## 2018-07-08 DIAGNOSIS — J45909 Unspecified asthma, uncomplicated: Secondary | ICD-10-CM | POA: Insufficient documentation

## 2018-07-08 DIAGNOSIS — Z862 Personal history of diseases of the blood and blood-forming organs and certain disorders involving the immune mechanism: Secondary | ICD-10-CM

## 2018-07-08 DIAGNOSIS — Z6841 Body Mass Index (BMI) 40.0 and over, adult: Secondary | ICD-10-CM | POA: Diagnosis not present

## 2018-07-08 DIAGNOSIS — E559 Vitamin D deficiency, unspecified: Secondary | ICD-10-CM | POA: Diagnosis not present

## 2018-07-08 HISTORY — PX: DILITATION & CURRETTAGE/HYSTROSCOPY WITH HYDROTHERMAL ABLATION: SHX5570

## 2018-07-08 LAB — PREGNANCY, URINE: PREG TEST UR: NEGATIVE

## 2018-07-08 SURGERY — DILATATION & CURETTAGE/HYSTEROSCOPY WITH HYDROTHERMAL ABLATION
Anesthesia: General | Site: Vagina

## 2018-07-08 MED ORDER — SODIUM CHLORIDE 0.9 % IR SOLN
Status: DC | PRN
  Administered 2018-07-08: 3000 mL

## 2018-07-08 MED ORDER — SCOPOLAMINE 1 MG/3DAYS TD PT72
1.0000 | MEDICATED_PATCH | Freq: Once | TRANSDERMAL | Status: DC
  Administered 2018-07-08: 1.5 mg via TRANSDERMAL

## 2018-07-08 MED ORDER — HYDROMORPHONE HCL 1 MG/ML IJ SOLN
INTRAMUSCULAR | Status: AC
  Administered 2018-07-08: 0.5 mg via INTRAVENOUS
  Filled 2018-07-08: qty 0.5

## 2018-07-08 MED ORDER — SUCCINYLCHOLINE CHLORIDE 20 MG/ML IJ SOLN
INTRAMUSCULAR | Status: DC | PRN
  Administered 2018-07-08: 180 mg via INTRAVENOUS

## 2018-07-08 MED ORDER — KETOROLAC TROMETHAMINE 30 MG/ML IJ SOLN
INTRAMUSCULAR | Status: AC
  Filled 2018-07-08: qty 1

## 2018-07-08 MED ORDER — ONDANSETRON HCL 4 MG/2ML IJ SOLN
INTRAMUSCULAR | Status: AC
  Filled 2018-07-08: qty 2

## 2018-07-08 MED ORDER — HYDROMORPHONE HCL 1 MG/ML IJ SOLN
INTRAMUSCULAR | Status: AC
  Filled 2018-07-08: qty 0.5

## 2018-07-08 MED ORDER — OXYCODONE HCL 5 MG PO TABS
ORAL_TABLET | ORAL | Status: AC
  Filled 2018-07-08: qty 1

## 2018-07-08 MED ORDER — GLYCOPYRROLATE 0.2 MG/ML IJ SOLN
INTRAMUSCULAR | Status: DC | PRN
  Administered 2018-07-08: 0.2 mg via INTRAVENOUS

## 2018-07-08 MED ORDER — LIDOCAINE HCL (PF) 2 % IJ SOLN
INTRAMUSCULAR | Status: DC | PRN
  Administered 2018-07-08: 10 mL

## 2018-07-08 MED ORDER — DEXAMETHASONE SODIUM PHOSPHATE 4 MG/ML IJ SOLN
INTRAMUSCULAR | Status: AC
  Filled 2018-07-08: qty 1

## 2018-07-08 MED ORDER — HYDROMORPHONE HCL 1 MG/ML IJ SOLN
0.5000 mg | Freq: Once | INTRAMUSCULAR | Status: AC
  Administered 2018-07-08: 0.5 mg via INTRAVENOUS

## 2018-07-08 MED ORDER — PROPOFOL 10 MG/ML IV BOLUS
INTRAVENOUS | Status: DC | PRN
  Administered 2018-07-08: 200 mg via INTRAVENOUS

## 2018-07-08 MED ORDER — FENTANYL CITRATE (PF) 100 MCG/2ML IJ SOLN
INTRAMUSCULAR | Status: DC | PRN
  Administered 2018-07-08 (×2): 50 ug via INTRAVENOUS

## 2018-07-08 MED ORDER — GLYCOPYRROLATE 0.2 MG/ML IJ SOLN
INTRAMUSCULAR | Status: AC
  Filled 2018-07-08: qty 1

## 2018-07-08 MED ORDER — OXYCODONE HCL 5 MG PO CAPS: 5.0000 mg | ORAL_CAPSULE | ORAL | 0 refills | Status: DC | PRN

## 2018-07-08 MED ORDER — SCOPOLAMINE 1 MG/3DAYS TD PT72
MEDICATED_PATCH | TRANSDERMAL | Status: AC
  Administered 2018-07-08: 1.5 mg via TRANSDERMAL
  Filled 2018-07-08: qty 1

## 2018-07-08 MED ORDER — MIDAZOLAM HCL 2 MG/2ML IJ SOLN
INTRAMUSCULAR | Status: DC | PRN
  Administered 2018-07-08: 2 mg via INTRAVENOUS

## 2018-07-08 MED ORDER — PROMETHAZINE HCL 25 MG/ML IJ SOLN: 6.2500 mg | INTRAMUSCULAR | Status: DC | PRN

## 2018-07-08 MED ORDER — ONDANSETRON HCL 4 MG/2ML IJ SOLN
INTRAMUSCULAR | Status: DC | PRN
  Administered 2018-07-08: 4 mg via INTRAVENOUS

## 2018-07-08 MED ORDER — KETOROLAC TROMETHAMINE 30 MG/ML IJ SOLN
INTRAMUSCULAR | Status: DC | PRN
  Administered 2018-07-08: 30 mg via INTRAVENOUS

## 2018-07-08 MED ORDER — FENTANYL CITRATE (PF) 100 MCG/2ML IJ SOLN
INTRAMUSCULAR | Status: AC
  Filled 2018-07-08: qty 2

## 2018-07-08 MED ORDER — DEXAMETHASONE SODIUM PHOSPHATE 10 MG/ML IJ SOLN
INTRAMUSCULAR | Status: DC | PRN
  Administered 2018-07-08: 4 mg via INTRAVENOUS

## 2018-07-08 MED ORDER — FENTANYL CITRATE (PF) 100 MCG/2ML IJ SOLN
25.0000 ug | INTRAMUSCULAR | Status: DC | PRN
  Administered 2018-07-08 (×3): 50 ug via INTRAVENOUS

## 2018-07-08 MED ORDER — LIDOCAINE HCL (CARDIAC) PF 100 MG/5ML IV SOSY
PREFILLED_SYRINGE | INTRAVENOUS | Status: AC
  Filled 2018-07-08: qty 5

## 2018-07-08 MED ORDER — SUCCINYLCHOLINE CHLORIDE 200 MG/10ML IV SOSY
PREFILLED_SYRINGE | INTRAVENOUS | Status: AC
  Filled 2018-07-08: qty 10

## 2018-07-08 MED ORDER — OXYCODONE HCL 5 MG PO TABS
5.0000 mg | ORAL_TABLET | Freq: Once | ORAL | Status: AC
  Administered 2018-07-08: 5 mg via ORAL

## 2018-07-08 MED ORDER — PROPOFOL 10 MG/ML IV BOLUS
INTRAVENOUS | Status: AC
  Filled 2018-07-08: qty 20

## 2018-07-08 MED ORDER — LIDOCAINE HCL (CARDIAC) PF 100 MG/5ML IV SOSY
PREFILLED_SYRINGE | INTRAVENOUS | Status: DC | PRN
  Administered 2018-07-08: 100 mg via INTRAVENOUS

## 2018-07-08 MED ORDER — LACTATED RINGERS IV SOLN
INTRAVENOUS | Status: DC
  Administered 2018-07-08 (×2): via INTRAVENOUS

## 2018-07-08 MED ORDER — MIDAZOLAM HCL 2 MG/2ML IJ SOLN
INTRAMUSCULAR | Status: AC
  Filled 2018-07-08: qty 2

## 2018-07-08 MED ORDER — KETOROLAC TROMETHAMINE 30 MG/ML IJ SOLN: 30.0000 mg | Freq: Once | INTRAMUSCULAR | Status: DC | PRN

## 2018-07-08 SURGICAL SUPPLY — 15 items
CANISTER SUCT 3000ML PPV (MISCELLANEOUS) ×3 IMPLANT
CATH ROBINSON RED A/P 16FR (CATHETERS) ×3 IMPLANT
GAUZE SPONGE 4X4 16PLY XRAY LF (GAUZE/BANDAGES/DRESSINGS) ×3 IMPLANT
GAUZE VASELINE 3X9 (GAUZE/BANDAGES/DRESSINGS) ×3 IMPLANT
GLOVE BIO SURGEON STRL SZ7.5 (GLOVE) ×3 IMPLANT
GLOVE BIOGEL PI IND STRL 7.0 (GLOVE) ×1 IMPLANT
GLOVE BIOGEL PI IND STRL 7.5 (GLOVE) ×2 IMPLANT
GLOVE BIOGEL PI INDICATOR 7.0 (GLOVE) ×2
GLOVE BIOGEL PI INDICATOR 7.5 (GLOVE) ×4
GOWN STRL REUS W/TWL LRG LVL3 (GOWN DISPOSABLE) ×6 IMPLANT
PACK VAGINAL MINOR WOMEN LF (CUSTOM PROCEDURE TRAY) ×3 IMPLANT
PAD OB MATERNITY 4.3X12.25 (PERSONAL CARE ITEMS) ×3 IMPLANT
SET GENESYS HTA PROCERVA (MISCELLANEOUS) ×3 IMPLANT
SUT VICRYL 0 UR6 27IN ABS (SUTURE) ×3 IMPLANT
TOWEL OR 17X24 6PK STRL BLUE (TOWEL DISPOSABLE) ×6 IMPLANT

## 2018-07-08 NOTE — Transfer of Care (Signed)
Immediate Anesthesia Transfer of Care Note  Patient: Laurie Peters  Procedure(s) Performed: DILATATION & CURETTAGE/HYSTEROSCOPY WITH HYDROTHERMAL ABLATION (N/A Vagina )  Patient Location: PACU  Anesthesia Type:General  Level of Consciousness: awake, alert  and oriented  Airway & Oxygen Therapy: Patient Spontanous Breathing and Patient connected to nasal cannula oxygen  Post-op Assessment: Report given to RN and Post -op Vital signs reviewed and stable  Post vital signs: Reviewed and stable  Last Vitals:  Vitals Value Taken Time  BP    Temp 36.6 C 07/08/2018 11:45 AM  Pulse 91 07/08/2018 11:48 AM  Resp 9 07/08/2018 11:48 AM  SpO2 99 % 07/08/2018 11:48 AM  Vitals shown include unvalidated device data.  Last Pain:  Vitals:   07/08/18 0930  TempSrc: Oral      Patients Stated Pain Goal: 3 (64/35/39 1225)  Complications: No apparent anesthesia complications

## 2018-07-08 NOTE — Anesthesia Procedure Notes (Addendum)
Procedure Name: Intubation Date/Time: 07/08/2018 10:10 AM Performed by: Bufford Spikes, CRNA Pre-anesthesia Checklist: Patient identified, Emergency Drugs available, Suction available and Patient being monitored Patient Re-evaluated:Patient Re-evaluated prior to induction Oxygen Delivery Method: Circle system utilized Preoxygenation: Pre-oxygenation with 100% oxygen Induction Type: IV induction Ventilation: Mask ventilation without difficulty Laryngoscope Size: Glidescope and 3 Tube type: Oral Tube size: 7.0 mm Number of attempts: 1 Airway Equipment and Method: Stylet Placement Confirmation: ETT inserted through vocal cords under direct vision,  positive ETCO2 and breath sounds checked- equal and bilateral Secured at: 21 cm Tube secured with: Tape Dental Injury: Teeth and Oropharynx as per pre-operative assessment  Difficulty Due To: Difficult Airway- due to dentition, Difficult Airway- due to large tongue and Difficulty was anticipated Future Recommendations: Recommend- induction with short-acting agent, and alternative techniques readily available

## 2018-07-08 NOTE — Discharge Instructions (Signed)

## 2018-07-08 NOTE — Interval H&P Note (Signed)
History and Physical Interval Note:  07/08/2018 9:34 AM  Christean Leaf Bigley  has presented today for surgery, with the diagnosis of Menorrhagia, Fibroids, Endometrial Polyp  The various methods of treatment have been discussed with the patient and family. After consideration of risks, benefits and other options for treatment, the patient has consented to  Procedure(s): DILATATION & CURETTAGE/HYSTEROSCOPY WITH HYDROTHERMAL ABLATION (N/A) as a surgical intervention .  The patient's history has been reviewed, patient examined, no change in status, stable for surgery.  I have reviewed the patient's chart and labs.  Questions were answered to the patient's satisfaction.     Delice Lesch

## 2018-07-08 NOTE — Op Note (Signed)
Preop Diagnosis: Menorrhagia, Fibroids, Endometrial Polyp   Postop Diagnosis: menorrhagia, fibroids, endometrial polyp   Procedure: DILATATION & CURETTAGE/HYSTEROSCOPY WITH HYDROTHERMAL ABLATION   Anesthesia: General   Anesthesiologist: Brennan Bailey, MD   Attending: Everett Graff, MD   Assistant: N/a  Findings: Large cavity and a polyp at fundus  Pathology: Endometrial curettings and portion of polyp  Fluids: 1100 cc  UOP: 25 cc  EBL: 50 cc  Complications: None  Procedure:The patient was taken to the operating room after the risks, benefits and alternatives discussed with the patient. The patient verbalized understanding and consent signed and witnessed. The patient was given a spinal per anesthesia and prepped and draped in the normal sterile fashion and Time Out performed per protocol. A bivalve speculum was placed in the patient's vagina and the anterior lip of the cervix was grasped with a single tooth tenaculum. A paracervical block was administered using a total of 10 cc of 2% lidocaine. The uterus sounded to 16 cm. The cervix was already dilated for passage of the hysteroscope. The hysteroscope was introduced into the uterine cavity and findings as noted above. Attempt made to remove polyp which was at the fundus and small portion was able to be removed but cavity too large to reach to the fundus.  Sharp curettage was performed until a gritty texture was noted and currettings sent to pathology. The hysteroscope was reintroduced and no obvious remaining intracavitary lesions were noted. The hydrothermal hysteroscopy was introduced and ablation performed without difficulty after several tenaculums placed on cervix to prevent leak and vaseline gauze placed as well. Hysteroscope reintroduced and good ablation results were noted. All instruments were removed. There was some bleeding at one of the tenaculum sites and a stitch of 0 vicryl was placed with good hemostasis.   Sponge lap and needle count was correct. The patient tolerated the procedure well and was returned to the recovery room in good condition.

## 2018-07-08 NOTE — Anesthesia Postprocedure Evaluation (Signed)
Anesthesia Post Note  Patient: Laurie Peters  Procedure(s) Performed: DILATATION & CURETTAGE/HYSTEROSCOPY WITH HYDROTHERMAL ABLATION (N/A Vagina )     Patient location during evaluation: PACU Anesthesia Type: General Level of consciousness: awake and alert Pain management: pain level controlled Vital Signs Assessment: post-procedure vital signs reviewed and stable Respiratory status: spontaneous breathing, nonlabored ventilation and respiratory function stable Cardiovascular status: blood pressure returned to baseline and stable Postop Assessment: no apparent nausea or vomiting Anesthetic complications: no    Last Vitals:  Vitals:   07/08/18 0930 07/08/18 1145  BP: (!) 154/95 (!) 154/102  Pulse: 99 95  Resp: 16   Temp: 36.4 C 36.6 C  SpO2: 99% 99%    Last Pain:  Vitals:   07/08/18 0930  TempSrc: Oral   Pain Goal: Patients Stated Pain Goal: 3 (07/08/18 0930)               Brennan Bailey

## 2018-07-09 ENCOUNTER — Encounter (HOSPITAL_COMMUNITY): Payer: Self-pay | Admitting: Obstetrics and Gynecology

## 2018-07-19 ENCOUNTER — Encounter: Payer: Self-pay | Admitting: Endocrinology

## 2018-07-21 NOTE — Progress Notes (Signed)
Name: Laurie Peters  MRN/ DOB: [id number], 12-11-1960    Age/ Sex: 57 y.o., female    PCP: Billie Ruddy, MD   Reason for Endocrinology Evaluation: Abnormal TSH      Date of Initial Endocrinology Evaluation:  07/22/18    HPI: Laurie Peters is a 57 y.o. female with a past medical history of menorrhagia and anemia secondary to fibroids, HTN, and Asthma . The patient presented for initial endocrinology clinic visit on 07/22/2018 for consultative assistance with her Abnormal TFT's.    She was told in 2017 that she had abnormal thyroid tests, this was not investigated any further until September, 2019. Her main com[plaint at the time was fatigue. This has not been progressive. Its worse in the afternoon.    She is S/p D&C on 10/17. She started bleeding again 2 days ago.   She has noted dysphagia but no swelling or pain at the anterior neck. She denies exposure to radiation. Denies any recent viral infections.   Mother on levothyroxine.   HISTORY:  Past Medical History:  Past Medical History:  Diagnosis Date  . Asthma   . Heart murmur   . History of blood transfusion    after birth per patient  . Hypertension   . SVD (spontaneous vaginal delivery)    x 4  . Termination of pregnancy (fetus)    x 2  . Uterine fibroids affecting pregnancy, antepartum    Past Surgical History:  Past Surgical History:  Procedure Laterality Date  . CHOLECYSTECTOMY N/A 06/15/2016   Procedure: LAPAROSCOPIC CHOLECYSTECTOMY;  Surgeon: Coralie Keens, MD;  Location: [facility];  Service: General;  Laterality: N/A;  . DILITATION & CURRETTAGE/HYSTROSCOPY WITH HYDROTHERMAL ABLATION N/A 07/08/2018   Procedure: DILATATION & CURETTAGE/HYSTEROSCOPY WITH HYDROTHERMAL ABLATION;  Surgeon: Everett Graff, MD;  Location: [facility];  Service: Gynecology;  Laterality: N/A;  . WISDOM TOOTH EXTRACTION     only 2 removed      Social History:  reports that she has never smoked. She has never used  smokeless tobacco. She reports that she drinks alcohol. She reports that she does not use drugs.  Family History: family history includes Asthma in her sister and son; Cancer in her paternal grandmother and sister; Diabetes in her brother; Heart disease in her brother, brother, brother, father, maternal grandmother, and paternal grandmother; Hyperlipidemia in her maternal grandmother and paternal grandmother; Hypertension in her father and mother.   HOME MEDICATIONS: Current Outpatient Medications on File Prior to Visit  Medication Sig Dispense Refill  . albuterol (PROVENTIL HFA;VENTOLIN HFA) 108 (90 Base) MCG/ACT inhaler Inhale 2 puffs into the lungs every 4 (four) hours as needed for wheezing or shortness of breath.    Marland Kitchen albuterol (PROVENTIL) (2.5 MG/3ML) 0.083% nebulizer solution Take 2.5 mg by nebulization every 4 (four) hours as needed for wheezing or shortness of breath.    Marland Kitchen CALCIUM-VITAMIN D PO Take 1 tablet by mouth daily.    . chlorthalidone (HYGROTON) 25 MG tablet Take 1 tablet (25 mg total) by mouth daily. 90 tablet 3   No current facility-administered medications on file prior to visit.       REVIEW OF SYSTEMS: A comprehensive ROS was conducted with the patient and is negative except as per HPI and below:  Review of Systems  Constitutional: Positive for malaise/fatigue. Negative for weight loss.       Has gained 116 lbs since 2017  HENT: Positive for congestion. Negative for sore throat.  Eyes: Negative.  Negative for blurred vision and pain.  Respiratory: Negative for cough and shortness of breath.   Cardiovascular: Negative for chest pain and palpitations.  Gastrointestinal: Negative for constipation and nausea.  Genitourinary: Negative for frequency.  Neurological: Negative for tingling and tremors.  Endo/Heme/Allergies: Negative for polydipsia.  Psychiatric/Behavioral: Negative for depression. The patient is not nervous/anxious.        OBJECTIVE:  VS: BP 126/78  (BP Location: Left Arm)   Pulse 95   Ht 5\' 6"  (1.676 m)   Wt (!) 334 lb 9.6 oz (151.8 kg)   LMP  (LMP Unknown)   SpO2 96%   BMI 54.01 kg/m    Wt Readings from Last 3 Encounters:  07/22/18 (!) 334 lb 9.6 oz (151.8 kg)  07/08/18 (!) 338 lb (153.3 kg)  06/25/18 (!) 337 lb 8 oz (153.1 kg)     EXAM: General: Pt appears well and is in NAD  Hydration: Well-hydrated with moist mucous membranes and good skin turgor  Eyes: External eye exam normal without stare, lid lag or exophthalmos.  EOM intact.  PERRL.  Ears, Nose, Throat: Hearing: Grossly intact bilaterally Dental: Good dentition  Throat: Clear without mass, erythema or exudate  Neck: General: Supple without adenopathy. Thyroid: Thyroid size normal.  No goiter or nodules appreciated. No thyroid bruit.  Lungs: Clear with good BS bilat with no rales, rhonchi, or wheezes  Heart: Auscultation: RRR.  Abdomen: Normoactive bowel sounds, soft, nontender, without masses or organomegaly palpable  Extremities: Gait and station: Normal gait  Head and neck: Normal alignment and mobility BL UE: Normal ROM and strength. BL LE: No pretibial edema normal ROM and strength.  Skin: Hair: Texture and amount normal with gender appropriate distribution Skin Inspection: No rashes, acanthosis nigricans/skin tags.No stria.  Skin Palpation: Skin temperature, texture, and thickness normal to palpation  Neuro: Cranial nerves: II - XII grossly intact  Cerebellar: Normal coordination and movement; no tremor Motor: Normal strength throughout DTRs: 2+ and symmetric in UE without delay in relaxation phase  Mental Status: Judgment, insight: Intact Orientation: Oriented to time, place, and person Mood and affect: No depression, anxiety, or agitation     DATA REVIEWED: 06/15/18  TSH 15.2 uIU/mL  FT4 0.9 ng/dL   Results for SHYE, DOTY (MRN [id number]) as of 07/22/2018 12:37  Ref. Range 07/22/2018 08:23  TSH Latest Ref Range: 0.35 - 4.50 uIU/mL 13.42  (H)  T4,Free(Direct) Latest Ref Range: 0.60 - 1.60 ng/dL 0.58 (L)    Old records , labs and images have been reviewed.    ASSESSMENT/PLAN/RECOMMENDATIONS:   1. Abnormal TSH   - She is clinically euthyroid - Repeat labs today show hypothyroidism  - Will start Levothyroxine 112 mcg daily  - Unable to contact the patient as there's no voice mail set up. I will send her a letter with detailed instructions and the need to have TSH rechecked in 8 weeks   2. Morbid Obesity:  - She was advised to start using "myfitness pal" She was advised to reduce her calorie intake by 200 kCal per week to reach a goal 1200-1500 kCal/week - Encouraged her to avoid snacking and eat low calorie snacks, to eat 3 consistent meals a day  - She was also encouraged to start exercising 30 minutes, 4x a week. Brisk walking is recommended.     F/u in 4 months   Signed electronically by: Mack Guise, MD  [facility]  [facility] [address] [place]  [place],  [address] Phone: [phone] FAX: [phone]   CC: Billie Ruddy, [place] [address] Phone: [phone] Fax: [phone]   Return to Endocrinology clinic as below: No future appointments.

## 2018-07-22 ENCOUNTER — Encounter: Payer: Self-pay | Admitting: Internal Medicine

## 2018-07-22 ENCOUNTER — Ambulatory Visit (INDEPENDENT_AMBULATORY_CARE_PROVIDER_SITE_OTHER): Payer: BLUE CROSS/BLUE SHIELD | Admitting: Internal Medicine

## 2018-07-22 VITALS — BP 126/78 | HR 95 | Ht 66.0 in | Wt 334.6 lb

## 2018-07-22 DIAGNOSIS — E669 Obesity, unspecified: Secondary | ICD-10-CM

## 2018-07-22 DIAGNOSIS — R7989 Other specified abnormal findings of blood chemistry: Secondary | ICD-10-CM

## 2018-07-22 DIAGNOSIS — N939 Abnormal uterine and vaginal bleeding, unspecified: Secondary | ICD-10-CM | POA: Diagnosis not present

## 2018-07-22 LAB — TSH: TSH: 13.42 u[IU]/mL — AB (ref 0.35–4.50)

## 2018-07-22 LAB — T4, FREE: Free T4: 0.58 ng/dL — ABNORMAL LOW (ref 0.60–1.60)

## 2018-07-22 MED ORDER — LEVOTHYROXINE SODIUM 112 MCG PO TABS: 112.0000 ug | ORAL_TABLET | Freq: Every day | ORAL | 2 refills | Status: DC

## 2018-07-22 NOTE — Patient Instructions (Signed)
-   Please stop by the lab today. We will contact you with results - If we start you on Levothyroxine , we will need to have another set of labs in 8 weeks - If we start you on levothyroxine, please remember to take it on an empty stomach, 30-60 minutes before breakfast.  - Thyroid hormone and vitamins need to be seperated from each other by 4 hours - Use my fitness pal App. Document eaten calories and reduce by 200 calories every week, until you reach a goal of 1200-1500 calories a week - Start moderate exercise in the form of briak walking 30 minutes , four times a week

## 2018-08-13 ENCOUNTER — Other Ambulatory Visit: Payer: Self-pay | Admitting: Family Medicine

## 2018-08-13 DIAGNOSIS — I10 Essential (primary) hypertension: Secondary | ICD-10-CM

## 2018-09-10 ENCOUNTER — Other Ambulatory Visit: Payer: BLUE CROSS/BLUE SHIELD

## 2018-10-08 ENCOUNTER — Other Ambulatory Visit: Payer: BLUE CROSS/BLUE SHIELD

## 2018-10-29 ENCOUNTER — Other Ambulatory Visit (INDEPENDENT_AMBULATORY_CARE_PROVIDER_SITE_OTHER): Payer: BLUE CROSS/BLUE SHIELD

## 2018-10-29 ENCOUNTER — Encounter: Payer: Self-pay | Admitting: Internal Medicine

## 2018-10-29 ENCOUNTER — Ambulatory Visit: Payer: BLUE CROSS/BLUE SHIELD | Admitting: Internal Medicine

## 2018-10-29 DIAGNOSIS — R7989 Other specified abnormal findings of blood chemistry: Secondary | ICD-10-CM

## 2018-10-29 LAB — TSH: TSH: 1.41 u[IU]/mL (ref 0.35–4.50)

## 2018-11-12 ENCOUNTER — Ambulatory Visit: Payer: BLUE CROSS/BLUE SHIELD | Admitting: Internal Medicine

## 2018-11-23 ENCOUNTER — Ambulatory Visit (INDEPENDENT_AMBULATORY_CARE_PROVIDER_SITE_OTHER): Payer: BLUE CROSS/BLUE SHIELD | Admitting: Internal Medicine

## 2018-11-23 ENCOUNTER — Encounter: Payer: Self-pay | Admitting: Internal Medicine

## 2018-11-23 ENCOUNTER — Other Ambulatory Visit: Payer: Self-pay

## 2018-11-23 VITALS — BP 132/84 | HR 77 | Ht 66.0 in | Wt 332.8 lb

## 2018-11-23 DIAGNOSIS — E039 Hypothyroidism, unspecified: Secondary | ICD-10-CM | POA: Diagnosis not present

## 2018-11-23 LAB — TSH: TSH: 16.76 u[IU]/mL — ABNORMAL HIGH (ref 0.35–4.50)

## 2018-11-23 LAB — T4, FREE: Free T4: 0.58 ng/dL — ABNORMAL LOW (ref 0.60–1.60)

## 2018-11-23 NOTE — Progress Notes (Signed)
Name: Laurie Peters  MRN/ DOB: [id number], October 21, 1960    Age/ Sex: 58 y.o., female     PCP: Billie Ruddy, MD   Reason for Endocrinology Evaluation: 07/22/2018     Initial Endocrinology Clinic Visit: Hypothyroidism     PATIENT IDENTIFIER: Laurie Peters is a 58 y.o., female with a past medical history of menorrhagia and anemia secondary to fibroids, HTN, and Asthma  . She has followed with [facility] since 07/22/2018 for consultative assistance with management of her hypothyroidism    HISTORICAL SUMMARY: The patient was first diagnosed with abnormal thyroid function tests in 2017, she was c/o fatigue at the time. This was not investigated again until 05/2018.   She was started on LT-4 replacement in 06/2018  Mother with hypothyroidism  SUBJECTIVE:   During last visit (07/22/2018): TSH was 13.42 u/mL. She was started on Levothyroxine 112 mcg daily    Today (11/23/2018):  [name] is here for a 4 month follow up on hypothyroidism. She has been compliant with LT-4 replacement. She takes appropriately on an empty stomach, at least 30 minutes before breakfast.   She has noted slight improvement in energy level, denies constipation, depression.  She denies local neck symptoms.   She has insomnia, with intermittent waking up.     ROS:  As per HPI.   HISTORY:  Past Medical History:  Past Medical History:  Diagnosis Date  . Asthma   . Heart murmur   . History of blood transfusion    after birth per patient  . Hypertension   . SVD (spontaneous vaginal delivery)    x 4  . Termination of pregnancy (fetus)    x 2  . Uterine fibroids affecting pregnancy, antepartum    Past Surgical History:  Past Surgical History:  Procedure Laterality Date  . CHOLECYSTECTOMY N/A 06/15/2016   Procedure: LAPAROSCOPIC CHOLECYSTECTOMY;  Surgeon: Coralie Keens, MD;  Location: [facility];  Service: General;  Laterality: N/A;  . DILITATION & CURRETTAGE/HYSTROSCOPY  WITH HYDROTHERMAL ABLATION N/A 07/08/2018   Procedure: DILATATION & CURETTAGE/HYSTEROSCOPY WITH HYDROTHERMAL ABLATION;  Surgeon: Everett Graff, MD;  Location: [facility];  Service: Gynecology;  Laterality: N/A;  . WISDOM TOOTH EXTRACTION     only 2 removed    Social History:  reports that she has never smoked. She has never used smokeless tobacco. She reports current alcohol use. She reports that she does not use drugs. Family History:  Family History  Problem Relation Age of Onset  . Hypertension Mother   . Hypertension Father   . Heart disease Father   . Asthma Sister   . Asthma Son   . Hyperlipidemia Maternal Grandmother   . Heart disease Maternal Grandmother   . Hyperlipidemia Paternal Grandmother   . Heart disease Paternal Grandmother   . Cancer Paternal Grandmother   . Cancer Sister        uterus  . Heart disease Brother   . Heart disease Brother   . Diabetes Brother   . Heart disease Brother      HOME MEDICATIONS: Allergies as of 11/23/2018      Reactions   Theraflu Cold & [phenylephrine-pheniramine-dm] Hives      Medication List       Accurate as of November 23, 2018  8:06 AM. Always use your most recent med list.        albuterol (2.5 MG/3ML) 0.083% nebulizer solution Commonly known as:  PROVENTIL Take 2.5 mg by nebulization every 4 (four)  hours as needed for wheezing or shortness of breath.   albuterol 108 (90 Base) MCG/ACT inhaler Commonly known as:  PROVENTIL HFA;VENTOLIN HFA Inhale 2 puffs into the lungs every 4 (four) hours as needed for wheezing or shortness of breath.   CALCIUM-VITAMIN D PO Take 1 tablet by mouth daily.   chlorthalidone 25 MG tablet Commonly known as:  HYGROTON Take 1 tablet (25 mg total) by mouth daily.   levothyroxine 112 MCG tablet Commonly known as:  SYNTHROID, LEVOTHROID Take 1 tablet (112 mcg total) by mouth daily.         OBJECTIVE:   PHYSICAL EXAM: VS: BP 132/84 (BP Location: Left Arm, Patient Position: Sitting,  Cuff Size: Large)   Pulse 77   Ht 5\' 6"  (1.676 m)   Wt (!) 332 lb 12.8 oz (151 kg)   SpO2 98%   BMI 53.72 kg/m    EXAM: General: Pt appears well and is in NAD  Neck: General: Supple without adenopathy. Thyroid: Thyroid size normal.  No goiter or nodules appreciated. No thyroid bruit.  Lungs: Clear with good BS bilat with no rales, rhonchi, or wheezes  Heart: Auscultation: RRR.  Abdomen: Normoactive bowel sounds, soft, nontender, without masses or organomegaly palpable  Extremities:  BL LE: No pretibial edema normal ROM and strength.  Skin: Hair: Texture and amount normal with gender appropriate distribution Skin Inspection: No rashes, acanthosis nigricans/skin tags.  Skin Palpation: Skin temperature, texture, and thickness normal to palpation  Neuro: Cranial nerves: II - XII grossly intact  Cerebellar: Normal coordination and movement; no tremor Motor: Normal strength throughout DTRs: 2+ and symmetric in UE without delay in relaxation phase  Mental Status: Judgment, insight: Intact Orientation: Oriented to time, place, and person Mood and affect: No depression, anxiety, or agitation     DATA REVIEWED: Results for LANEY, LOUDERBACK (MRN [id number]) as of 11/23/2018 15:42  Ref. Range 07/22/2018 08:23 10/29/2018 07:53 11/23/2018 08:17  TSH Latest Ref Range: 0.35 - 4.50 uIU/mL 13.42 (H) 1.41 16.76 (H)  T4,Free(Direct) Latest Ref Range: 0.60 - 1.60 ng/dL 0.58 (L)  0.58 (L)     ASSESSMENT / PLAN / RECOMMENDATIONS:   1. Hypothyroidism:   -Patient continues to have fatigue, that could be attributed to her thyroid condition.  Otherwise she has no symptoms of hypothyroidism. -Pt educated extensively on the correct way to take levothyroxine (first thing in the morning with water, 30 minutes before eating or taking other medications). - Pt encouraged to double dose the following day if she were to miss a dose given long half-life of levothyroxine. -Repeat TFTs show high TSH again, the  patient is missing a whole lot more L T4 replacement than she thinks she is.  Called her [facility] and the patient picked up 90 tablets the end of October 2019, and her next pickup was on November 15, 2018.  Patient missed at least 20 days of levothyroxine, which could explain her elevated TSH again.  Medications  Continue levothyroxine 112 MCG daily  Follow-up in 6 months  Addendum: Discussed TFT results with the patient on 11/23/2018 at 1545.  Discussed the importance of levothyroxine intake, I have encouraged her again to use a pillbox, and at the end of the week if there are any levothyroxine doses left she may take them altogether.  Signed electronically by: Mack Guise, MD  [facility]  [facility] [address] [place], [place] [address] Phone: [phone] FAX: [phone]  CC: Billie Ruddy, MD [place] [address] Phone: [phone]  Fax: [phone]   Return to Endocrinology clinic as below: No future appointments.

## 2018-11-23 NOTE — Patient Instructions (Signed)

## 2019-04-16 IMAGING — CR DG CHEST 2V
2 series · 2 of 2 positions shown · non-contrast
Comparison: Chest x-ray of June 15, 2016

CLINICAL DATA: Worsening asthma especially at night. It 3 weeks of
intermittent mid chest pain. Nonsmoker.

EXAM:
CHEST  2 VIEW

[w chest pa]
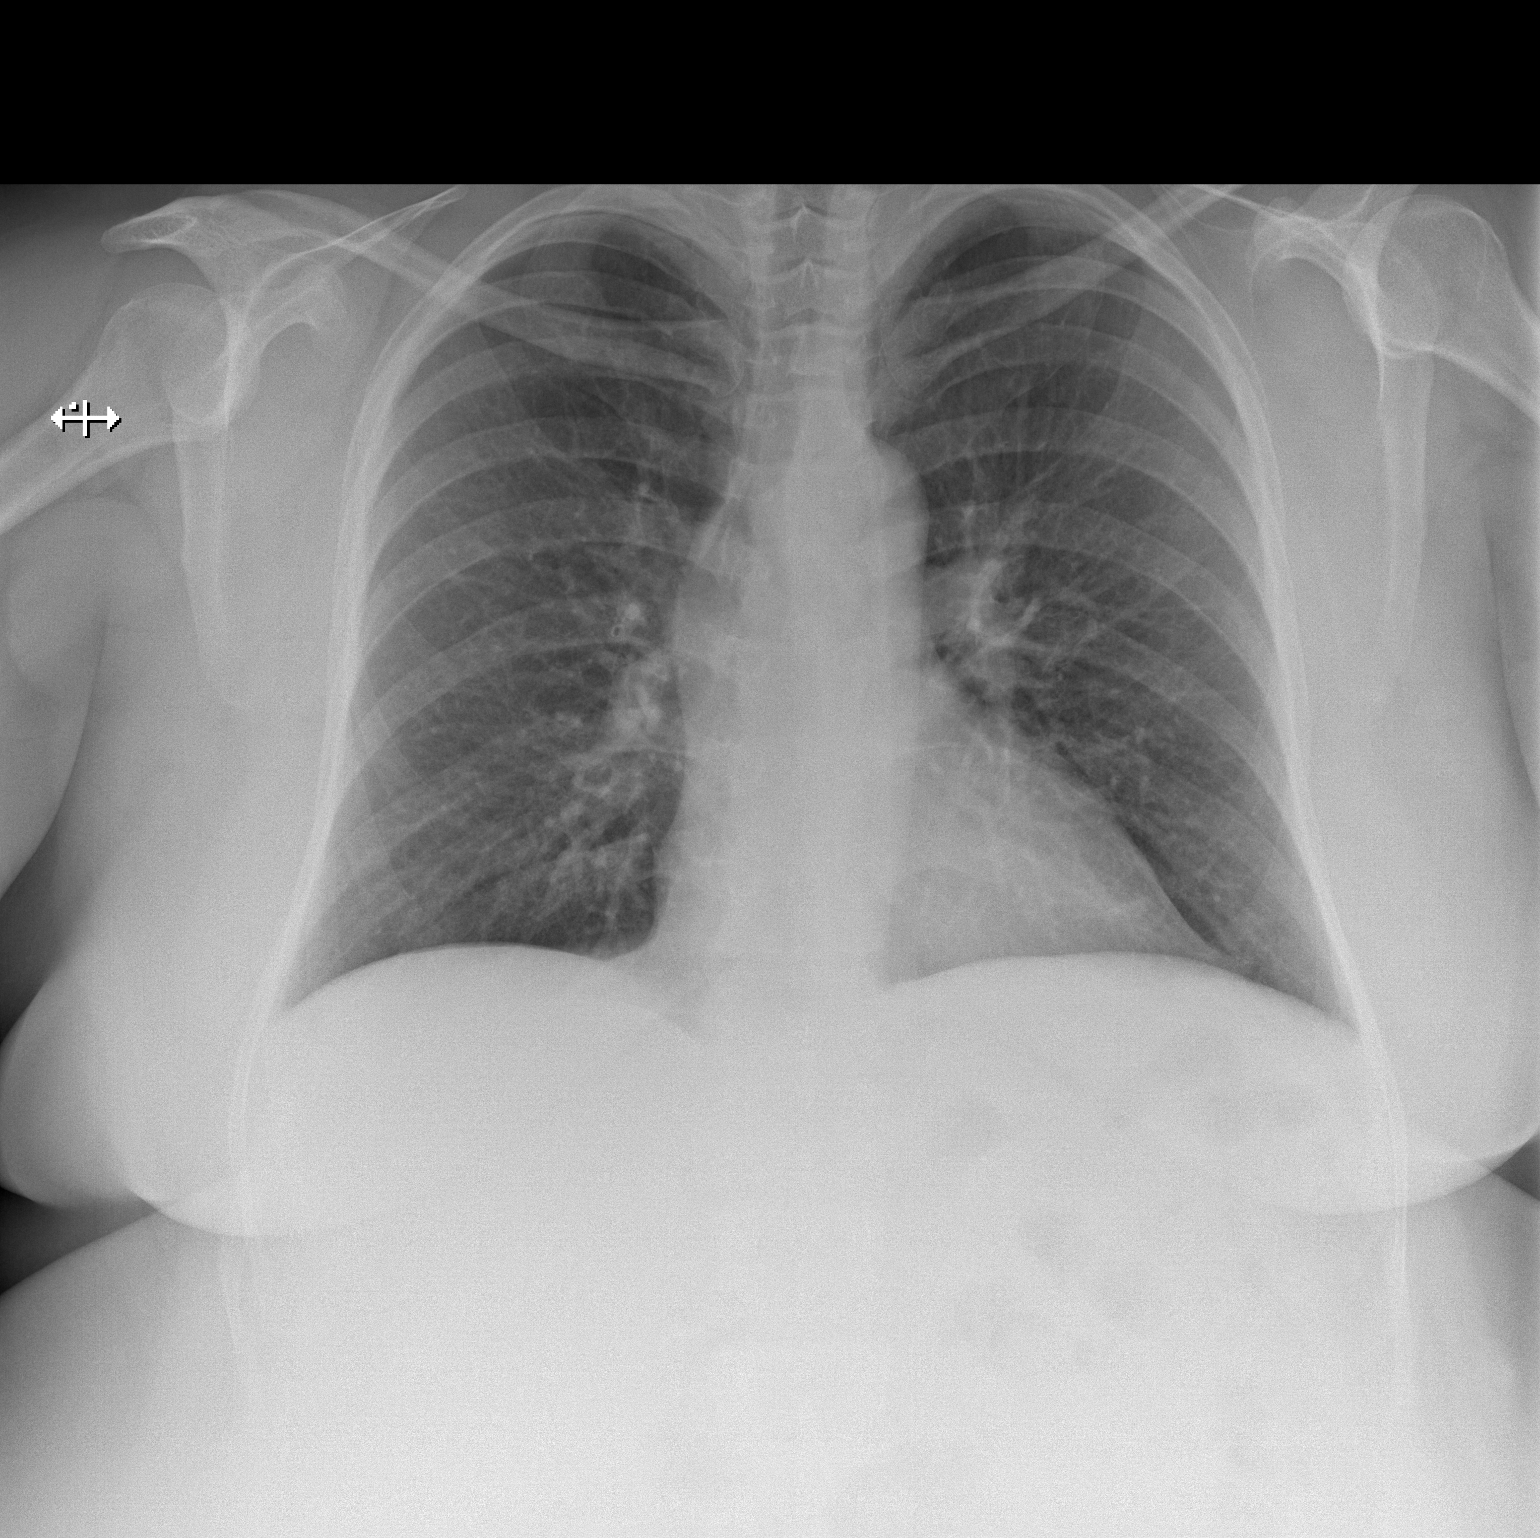

[w chest lat]
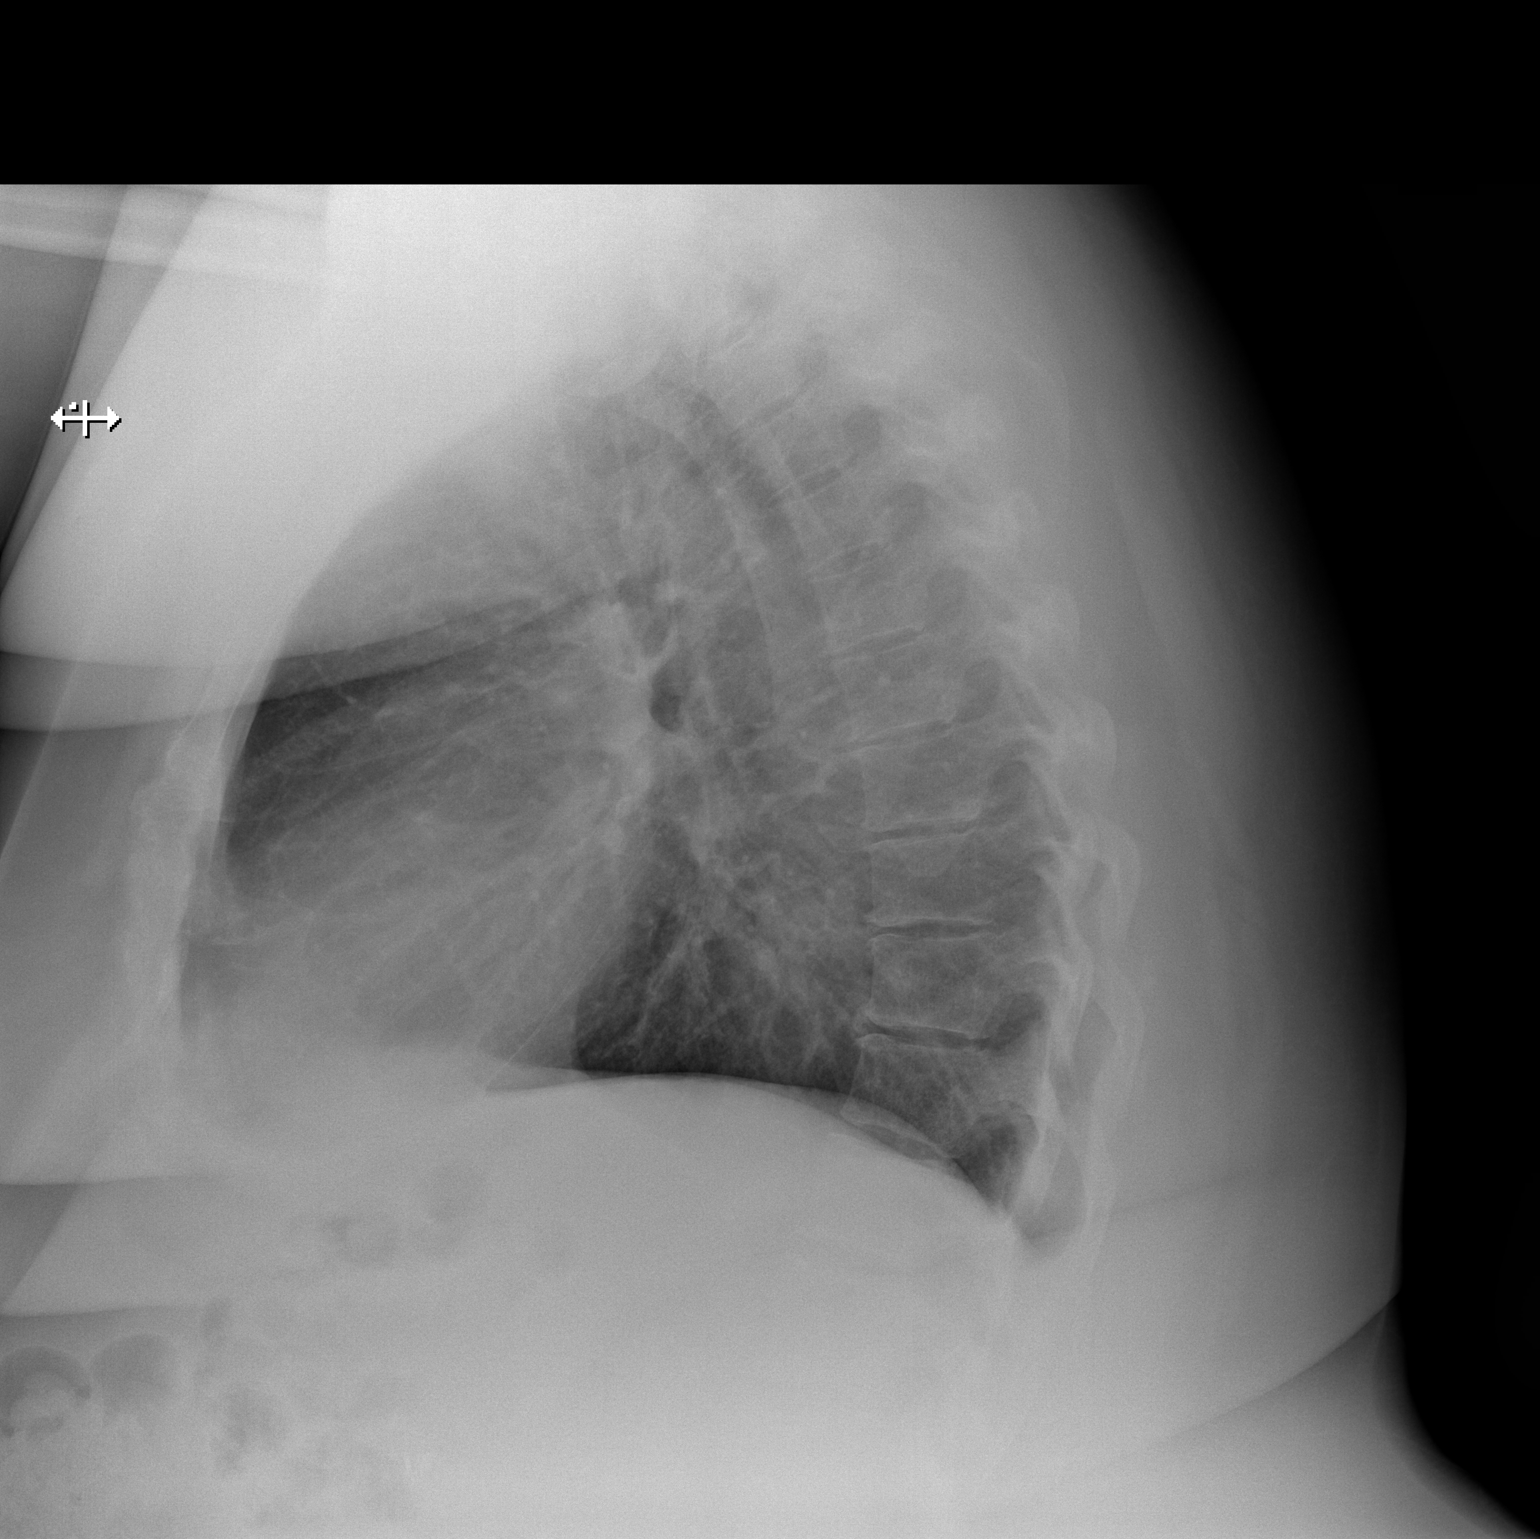

[2 of 2 positions shown; findings below may reference images not displayed]

FINDINGS: The lungs are adequately inflated. There is no focal infiltrate.
There is no pleural effusion. The heart and pulmonary vascularity
are normal. The mediastinum is normal in width. The bony thorax is
unremarkable.
IMPRESSION: There is no pneumonia nor other acute cardiopulmonary abnormality.

## 2019-05-24 ENCOUNTER — Other Ambulatory Visit: Payer: Self-pay

## 2019-05-26 ENCOUNTER — Encounter: Payer: Self-pay | Admitting: Family Medicine

## 2019-05-26 ENCOUNTER — Ambulatory Visit (INDEPENDENT_AMBULATORY_CARE_PROVIDER_SITE_OTHER): Payer: BC Managed Care – PPO | Admitting: Internal Medicine

## 2019-05-26 ENCOUNTER — Encounter: Payer: Self-pay | Admitting: Internal Medicine

## 2019-05-26 ENCOUNTER — Other Ambulatory Visit: Payer: Self-pay

## 2019-05-26 ENCOUNTER — Telehealth (INDEPENDENT_AMBULATORY_CARE_PROVIDER_SITE_OTHER): Payer: BC Managed Care – PPO | Admitting: Family Medicine

## 2019-05-26 VITALS — BP 120/78 | HR 95 | Temp 98.2°F | Ht 66.0 in | Wt 338.8 lb

## 2019-05-26 DIAGNOSIS — M545 Low back pain, unspecified: Secondary | ICD-10-CM

## 2019-05-26 DIAGNOSIS — I10 Essential (primary) hypertension: Secondary | ICD-10-CM

## 2019-05-26 DIAGNOSIS — E039 Hypothyroidism, unspecified: Secondary | ICD-10-CM | POA: Diagnosis not present

## 2019-05-26 LAB — T4, FREE: Free T4: 0.87 ng/dL (ref 0.60–1.60)

## 2019-05-26 LAB — TSH: TSH: 12.66 u[IU]/mL — ABNORMAL HIGH (ref 0.35–4.50)

## 2019-05-26 MED ORDER — CHLORTHALIDONE 25 MG PO TABS: 25.0000 mg | ORAL_TABLET | Freq: Every day | ORAL | 3 refills | Status: DC

## 2019-05-26 MED ORDER — LEVOTHYROXINE SODIUM 125 MCG PO TABS: 125.0000 ug | ORAL_TABLET | Freq: Every day | ORAL | 3 refills | Status: DC
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
## 2019-05-26 NOTE — Patient Instructions (Signed)

## 2019-05-26 NOTE — Progress Notes (Signed)
Name: Laurie Peters  MRN/ DOB: [id number], [birth year]    Age/ Sex: 58 y.o., female     PCP: Billie Ruddy, MD   Reason for Endocrinology Evaluation: 07/22/2018     Initial Endocrinology Clinic Visit: Hypothyroidism     PATIENT IDENTIFIER: Ms. Rigby Grove is a 58 y.o., female with a past medical history of menorrhagia and anemia secondary to fibroids, HTN, and Asthma  . She has followed with [facility] since 07/22/2018 for consultative assistance with management of her hypothyroidism    HISTORICAL SUMMARY: The patient was first diagnosed with abnormal thyroid function tests in 2017, she was c/o fatigue at the time. This was not investigated again until 05/2018.   She was started on LT-4 replacement in 06/2018  Mother with hypothyroidism  SUBJECTIVE:   During last visit (11/23/18): We continued Levothyroxine 112 mcg daily    Today (05/26/2019):  Ms. Siek is here for a 6 month follow up on hypothyroidism. She has been compliant with LT-4 replacement. She takes appropriately on an empty stomach, at least 30 minutes before breakfast.   She has noted slight improvement in energy level, denies constipation, depression.  She denies local neck symptoms.      ROS:  As per HPI.   HISTORY:  Past Medical History:  Past Medical History:  Diagnosis Date  . Asthma   . Heart murmur   . History of blood transfusion    after birth per patient  . Hypertension   . SVD (spontaneous vaginal delivery)    x 4  . Termination of pregnancy (fetus)    x 2  . Uterine fibroids affecting pregnancy, antepartum    Past Surgical History:  Past Surgical History:  Procedure Laterality Date  . CHOLECYSTECTOMY N/A 06/15/2016   Procedure: LAPAROSCOPIC CHOLECYSTECTOMY;  Surgeon: Coralie Keens, MD;  Location: [facility];  Service: General;  Laterality: N/A;  . DILITATION & CURRETTAGE/HYSTROSCOPY WITH HYDROTHERMAL ABLATION N/A 07/08/2018   Procedure: DILATATION &  CURETTAGE/HYSTEROSCOPY WITH HYDROTHERMAL ABLATION;  Surgeon: Everett Graff, MD;  Location: [facility];  Service: Gynecology;  Laterality: N/A;  . WISDOM TOOTH EXTRACTION     only 2 removed    Social History:  reports that she has never smoked. She has never used smokeless tobacco. She reports current alcohol use. She reports that she does not use drugs. Family History:  Family History  Problem Relation Age of Onset  . Hypertension Mother   . Hypertension Father   . Heart disease Father   . Asthma Sister   . Asthma Son   . Hyperlipidemia Maternal Grandmother   . Heart disease Maternal Grandmother   . Hyperlipidemia Paternal Grandmother   . Heart disease Paternal Grandmother   . Cancer Paternal Grandmother   . Cancer Sister        uterus  . Heart disease Brother   . Heart disease Brother   . Diabetes Brother   . Heart disease Brother      HOME MEDICATIONS: Allergies as of 05/26/2019      Reactions   Theraflu Cold & [phenylephrine-pheniramine-dm] Hives      Medication List       Accurate as of May 26, 2019 10:06 AM. If you have any questions, ask your nurse or doctor.        albuterol (2.5 MG/3ML) 0.083% nebulizer solution Commonly known as: PROVENTIL Take 2.5 mg by nebulization every 4 (four) hours as needed for wheezing or shortness of breath.   albuterol 108 (  90 Base) MCG/ACT inhaler Commonly known as: VENTOLIN HFA Inhale 2 puffs into the lungs every 4 (four) hours as needed for wheezing or shortness of breath.   CALCIUM-VITAMIN D PO Take 1 tablet by mouth daily.   chlorthalidone 25 MG tablet Commonly known as: HYGROTON Take 1 tablet (25 mg total) by mouth daily.   levothyroxine 112 MCG tablet Commonly known as: SYNTHROID Take 1 tablet (112 mcg total) by mouth daily.         OBJECTIVE:   PHYSICAL EXAM: VS: BP 120/78 (BP Location: Left Arm, Patient Position: Sitting, Cuff Size: Large)   Pulse 95   Temp 98.2 F (36.8 C)   Ht 5\' 6"  (1.676 m)   Wt  (!) 338 lb 12.8 oz (153.7 kg)   SpO2 97%   BMI 54.68 kg/m    EXAM: General: Pt appears well and is in NAD  Neck: General: Supple without adenopathy. Thyroid: Thyroid size normal.  No goiter or nodules appreciated. No thyroid bruit.  Lungs: Clear with good BS bilat with no rales, rhonchi, or wheezes  Heart: Auscultation: RRR.  Abdomen: Normoactive bowel sounds, soft, nontender, without masses or organomegaly palpable  Extremities:  BL LE: No pretibial edema normal ROM and strength.  Mental Status: Judgment, insight: Intact Orientation: Oriented to time, place, and person Mood and affect: No depression, anxiety, or agitation     DATA REVIEWED: Results for SHAKITA, VANBRUNT (MRN [id number]) as of 05/26/2019 13:13  Ref. Range 07/22/2018 08:23 10/29/2018 07:53 11/23/2018 08:17 05/26/2019 07:57  TSH Latest Ref Range: 0.35 - 4.50 uIU/mL 13.42 (H) 1.41 16.76 (H) 12.66 (H)  T4,Free(Direct) Latest Ref Range: 0.60 - 1.60 ng/dL 0.58 (L)  0.58 (L) 0.87      ASSESSMENT / PLAN / RECOMMENDATIONS:   1. Hypothyroidism:   -Pt is clinically euthyroid  - Repeat TFT's show normal FT4 but elevated TSH, historically she has not been consistent with taking LT-4 replacement per pharmracy pick up records. Will increase Levothyroxine dose as below   -Pt educated extensively on the correct way to take levothyroxine (first thing in the morning with water, 30 minutes before eating or taking other medications). - Pt encouraged to double dose the following day if she were to miss a dose given long half-life of levothyroxine.    Medications  Increase  levothyroxine 125 MCG daily Labs in 8 weeks    Follow-up in 1 year   Addendum: Discussed results with pt on 05/26/19 @ 1315 Signed electronically by: Mack Guise, MD  [facility]  [address] [place], [place] [address] Phone: [phone] FAX: [phone]      CC: Billie Ruddy, [place]  [place] [address] Phone: [phone]  Fax: [phone]   Return to Endocrinology clinic as below: Future Appointments  Date Time Provider [place]  05/25/2020  7:30 AM Aryia Delira, Melanie Crazier, MD [facility] None

## 2019-06-03 ENCOUNTER — Ambulatory Visit: Payer: BLUE CROSS/BLUE SHIELD | Admitting: Internal Medicine

## 2019-06-10 ENCOUNTER — Ambulatory Visit: Payer: BLUE CROSS/BLUE SHIELD | Admitting: Internal Medicine

## 2019-06-10 ENCOUNTER — Other Ambulatory Visit: Payer: Self-pay

## 2019-06-10 ENCOUNTER — Ambulatory Visit (INDEPENDENT_AMBULATORY_CARE_PROVIDER_SITE_OTHER)
Admission: RE | Admit: 2019-06-10 | Discharge: 2019-06-10 | Disposition: A | Discharge status: Home / Self Care | Payer: BC Managed Care – PPO | Source: Ambulatory Visit | Attending: Family Medicine | Admitting: Family Medicine

## 2019-06-10 ENCOUNTER — Telehealth: Payer: Self-pay

## 2019-06-10 DIAGNOSIS — M545 Low back pain, unspecified: Secondary | ICD-10-CM

## 2019-06-10 NOTE — Telephone Encounter (Signed)
Patient is calling back to speak to [place] regarding the order to be sent to radiology. [place]- [phone]

## 2019-06-10 NOTE — Telephone Encounter (Signed)
  Called pt left a message for pt to the office back regarding her radiology order    Copied from [place] [id number]. Topic: General - Other >> Jun 10, 2019  1:19 PM Rainey P[place] A wrote: Patient was a t radiology today and noticed that order was written for her back. Pt stated that order was suppose to be written for her back and hip and is requesting the order be sent to radiology for her to complete. Patient is aware  that Dr. Volanda Napoleon is out of office and is requesting a callback.

## 2019-06-13 NOTE — Telephone Encounter (Signed)
Called pt again and left a VM to call the office

## 2019-07-29 ENCOUNTER — Other Ambulatory Visit: Payer: BC Managed Care – PPO

## 2019-08-12 ENCOUNTER — Other Ambulatory Visit: Payer: Self-pay

## 2019-08-12 ENCOUNTER — Other Ambulatory Visit (INDEPENDENT_AMBULATORY_CARE_PROVIDER_SITE_OTHER): Payer: BC Managed Care – PPO

## 2019-08-12 DIAGNOSIS — Z01419 Encounter for gynecological examination (general) (routine) without abnormal findings: Secondary | ICD-10-CM | POA: Diagnosis not present

## 2019-08-12 DIAGNOSIS — Z139 Encounter for screening, unspecified: Secondary | ICD-10-CM | POA: Diagnosis not present

## 2019-08-12 DIAGNOSIS — E039 Hypothyroidism, unspecified: Secondary | ICD-10-CM | POA: Diagnosis not present

## 2019-08-12 DIAGNOSIS — Z124 Encounter for screening for malignant neoplasm of cervix: Secondary | ICD-10-CM | POA: Diagnosis not present

## 2019-08-12 DIAGNOSIS — Z1231 Encounter for screening mammogram for malignant neoplasm of breast: Secondary | ICD-10-CM | POA: Diagnosis not present

## 2019-08-12 LAB — TSH: TSH: 3.38 u[IU]/mL (ref 0.35–4.50)

## 2019-08-12 LAB — T4, FREE: Free T4: 1.27 ng/dL (ref 0.60–1.60)

## 2019-09-08 NOTE — Progress Notes (Signed)
Cardiology Office Note:    Date:  09/09/2019   ID:  Laurie Peters, DOB 23-Dec-1960, MRN [id number]  PCP:  Billie Ruddy, MD  Cardiologist:  No primary care provider on file.  Electrophysiologist:  None   Referring MD: Everett Graff, MD   Chief Complaint  Patient presents with  . Leg Swelling    History of Present Illness:    Laurie Peters is a 58 y.o. female with a hx of hypertension, asthma who is referred by Dr. Mancel Bale for an evaluation of lower extremity edema.  Ms. Bajo reports that she has had swelling in her legs for years.  States that she was on steroids for bleeding due to uterine fibroids which led to significant weight gain.  Steroids have been discontinued, fibroids were removed and the bleeding has stopped.  She does not exercise regularly.  States she works from home and sits for 8-9 hours per day.  Reports when she does go for walks that she gets chest tightness which she describes as substernal.  Reports BP usually well controlled at home has been 130s over 70s.  Reports brother had MI 40s, another brother had surgery to repair a hole in his heart and then also had an MI in 88s.  Father died of stroke in 23s.  No smoking history.  She does have history of asthma, but reports it is well controlled, needs to use rescue inhaler less than once per month.    Past Medical History:  Diagnosis Date  . Asthma   . Heart murmur   . History of blood transfusion    after birth per patient  . Hypertension   . SVD (spontaneous vaginal delivery)    x 4  . Termination of pregnancy (fetus)    x 2  . Uterine fibroids affecting pregnancy, antepartum     Past Surgical History:  Procedure Laterality Date  . CHOLECYSTECTOMY N/A 06/15/2016   Procedure: LAPAROSCOPIC CHOLECYSTECTOMY;  Surgeon: Coralie Keens, MD;  Location: [facility];  Service: General;  Laterality: N/A;  . DILITATION & CURRETTAGE/HYSTROSCOPY WITH HYDROTHERMAL ABLATION N/A 07/08/2018   Procedure:  DILATATION & CURETTAGE/HYSTEROSCOPY WITH HYDROTHERMAL ABLATION;  Surgeon: Everett Graff, MD;  Location: [facility];  Service: Gynecology;  Laterality: N/A;  . WISDOM TOOTH EXTRACTION     only 2 removed    Current Medications: Current Meds  Medication Sig  . albuterol (PROVENTIL HFA;VENTOLIN HFA) 108 (90 Base) MCG/ACT inhaler Inhale 2 puffs into the lungs every 4 (four) hours as needed for wheezing or shortness of breath.  Marland Kitchen albuterol (PROVENTIL) (2.5 MG/3ML) 0.083% nebulizer solution Take 2.5 mg by nebulization every 4 (four) hours as needed for wheezing or shortness of breath.  Marland Kitchen CALCIUM-VITAMIN D PO Take 1 tablet by mouth daily.  . chlorthalidone (HYGROTON) 25 MG tablet Take 1 tablet (25 mg total) by mouth daily.  Marland Kitchen levothyroxine (SYNTHROID) 125 MCG tablet Take 1 tablet (125 mcg total) by mouth daily.     Allergies:   Theraflu cold & [phenylephrine-pheniramine-dm]   Social History   Socioeconomic History  . Marital status: Single    Spouse name: Not on file  . Number of children: 4  . Years of education: Not on file  . Highest education level: Not on file  Occupational History  . Not on file  Tobacco Use  . Smoking status: Never Smoker  . Smokeless tobacco: Never Used  Substance and Sexual Activity  . Alcohol use: Yes    Comment: wine twice a  month  . Drug use: No  . Sexual activity: Not Currently    Birth control/protection: None  Other Topics Concern  . Not on file  Social History Narrative  . Not on file   Social Determinants of Health   Financial Resource Strain:   . Difficulty of Paying Living Expenses: Not on file  Food Insecurity:   . Worried About Charity fundraiser in the Last Year: Not on file  . Ran Out of Food in the Last Year: Not on file  Transportation Needs:   . Lack of Transportation (Medical): Not on file  . Lack of Transportation (Non-Medical): Not on file  Physical Activity:   . Days of Exercise per Week: Not on file  . Minutes of Exercise per  Session: Not on file  Stress:   . Feeling of Stress : Not on file  Social Connections:   . Frequency of Communication with Friends and Family: Not on file  . Frequency of Social Gatherings with Friends and Family: Not on file  . Attends Religious Services: Not on file  . Active Member of Clubs or Organizations: Not on file  . Attends Archivist Meetings: Not on file  . Marital Status: Not on file     Family History: The patient's family history includes Asthma in her sister and son; Cancer in her paternal grandmother and sister; Diabetes in her brother; Heart disease in her brother, brother, brother, father, maternal grandmother, and paternal grandmother; Hyperlipidemia in her maternal grandmother and paternal grandmother; Hypertension in her father and mother.  ROS:   Please see the history of present illness.     All other systems reviewed and are negative.  EKGs/Labs/Other Studies Reviewed:    The following studies were reviewed today:   EKG:  EKG is  ordered today.  The ekg ordered today demonstrates normal sinus rhythm, rate 66, no ST/T abnormalities  Recent Labs: 08/12/2019: TSH 3.38  Recent Lipid Panel No results found for: CHOL, TRIG, HDL, CHOLHDL, VLDL, LDLCALC, LDLDIRECT  Physical Exam:    VS:  BP (!) 148/84   Pulse 83   Ht 5\' 6"  (1.676 m)   Wt (!) 340 lb 3.2 oz (154.3 kg)   LMP  (LMP Unknown)   BMI 54.91 kg/m     Wt Readings from Last 3 Encounters:  09/09/19 (!) 340 lb 3.2 oz (154.3 kg)  05/26/19 (!) 338 lb 12.8 oz (153.7 kg)  11/23/18 (!) 332 lb 12.8 oz (151 kg)     GEN:  in no acute distress HEENT: Normal NECK: No JVD appreciated but difficult to assess given body habitus LYMPHATICS: No lymphadenopathy CARDIAC: RRR, no murmurs, rubs, gallops.  Distant heart sounds RESPIRATORY:  Clear to auscultation without rales, wheezing or rhonchi  ABDOMEN: Soft, non-tender, non-distended MUSCULOSKELETAL: 1+ pitting bilateral lower extremity edema SKIN:  Warm and dry NEUROLOGIC:  Alert and oriented x 3 PSYCHIATRIC:  Normal affect   ASSESSMENT:    1. Chest pain of uncertain etiology   2. Swelling of lower extremity   3. Snoring   4. Abnormal findings on diagnostic imaging of lung   5. SOB (shortness of breath)   6. Need for lipid screening   7. Essential hypertension    PLAN:     Chest pain: Description concerning for typical angina, as describes chest tightness with exertion that resolves with rest.  -Lexiscan Myoview to evaluate for ischemia  Lower extremity edema: Check echocardiogram to evaluate for structural heart disease.  LE  duplex to rule out DVT.  Will check CMET, BNP.  Hypertension: On chlorthalidone 25 mg daily.  Elevated in clinic today, reports has been under good control at home.  Asked to check blood pressure daily for next 2 weeks and call with results  Pulmonary nodule: 13 mm nodule in right middle lobe on CT chest 11/05/2017.  Measured 8 mm in 2017 abdominal CT but was partially covered at that time.  Repeat CT was recommended at 6 to 12 months, but appears this has not been done.  Will repeat CT chest to monitor pulmonary nodules  Snoring: Concern for OSA, will check sleep study  Cardiovascular risk assessment: No recent lipid panel, will check lipids  Hypothyroidism: Follows with endocrinology, on levothyroxine  RTC in 6 to 8 weeks   Medication Adjustments/Labs and Tests Ordered: Current medicines are reviewed at length with the patient today.  Concerns regarding medicines are outlined above.  Orders Placed This Encounter  Procedures  . CT Chest Wo Contrast  . Lipid Profile  . Comprehensive Metabolic Panel (CMET)  . B Nat Peptide  . Myocardial Perfusion Imaging  . ECHOCARDIOGRAM COMPLETE  . Split night study  . LE VENOUS   No orders of the defined types were placed in this encounter.   Patient Instructions  Medication Instructions:  Your physician recommends that you continue on your current  medications as directed. Please refer to the Current Medication list given to you today.  *If you need a refill on your cardiac medications before your next appointment, please call your pharmacy*  Lab Work: LP/CMET/BNP TODAY   COVID SCREENING 3 DAYS PRIOR TO SLEEP STUDY  TO BE DONE AT [facility]   If you have labs (blood work) drawn today and your tests are completely normal, you will receive your results only by: Marland Kitchen MyChart Message (if you have MyChart) OR . A paper copy in the mail If you have any lab test that is abnormal or we need to change your treatment, we will call you to review the results.  Testing/Procedures: Your physician has requested that you have an echocardiogram. Echocardiography is a painless test that uses sound waves to create images of your heart. It provides your doctor with information about the size and shape of your heart and how well your heart's chambers and valves are working. This procedure takes approximately one hour. There are no restrictions for this procedure.  Your physician has requested that you have a lexiscan myoview. For further information please visit [email or web address]. Please follow instruction sheet, as given.  BOTH OF THE ABOVE AT [address]  Your physician has requested that you have a lower or upper extremity venous duplex. This test is an ultrasound of the veins in the legs or arms. It looks at venous blood flow that carries blood from the heart to the legs or arms. Allow one hour for a Lower Venous exam. Allow thirty minutes for an Upper Venous exam. There are no restrictions or special instructions. LOWER EXTREMITY   Your physician has recommended that you have a sleep study. This test records several body functions during sleep, including: brain activity, eye movement, oxygen and carbon dioxide blood levels, heart rate and rhythm, breathing rate and rhythm, the flow of air through your mouth and  nose, snoring, body muscle movements, and chest and belly movement. THE OFFICE WILL CALL YOU TO SCHEDULE ONCE YOUR INSURANCE HAS APPROVED   Non-Cardiac CT scanning, (CAT scanning), is  a noninvasive, special x-ray that produces cross-sectional images of the body using x-rays and a computer. CT scans help physicians diagnose and treat medical conditions. For some CT exams, a contrast material is used to enhance visibility in the area of the body being studied. CT scans provide greater clarity and reveal more details than regular x-ray exams.  Follow-Up: At [facility], you and your health needs are our priority.  As part of our continuing mission to provide you with exceptional heart care, we have created designated Provider Care Teams.  These Care Teams include your primary Cardiologist (physician) and Advanced Practice Providers (APPs -  Physician Assistants and Nurse Practitioners) who all work together to provide you with the care you need, when you need it.  Your next appointment:   6-8 WEEKS IN OFFICE WITH DR [facility]  Other Instructions  MONITOR AND LOG YOUR BLOOD PRESSURE DAILY FOR 2 WEEKS CALL THE OFFICE WITH READINGS [phone]  CT Scan  A CT scan is a kind of X-ray. A CT scan makes pictures of the inside of your body. In this procedure, the pictures will be taken in a large machine that has an opening (CT scanner). What happens before the procedure? Staying hydrated Follow instructions from your doctor about hydration, which may include:  Up to 2 hours before the procedure - you may continue to drink clear liquids. These include water, clear fruit juice, black coffee, and plain tea. Eating and drinking restrictions Follow instructions from your doctor about eating and drinking, which may include:  24 hours before the procedure - stop drinking caffeinated drinks. These include energy drinks, tea, soda, coffee, and hot chocolate.  8 hours before the procedure - stop eating  heavy meals or foods. These include meat, fried foods, or fatty foods.  6 hours before the procedure - stop eating light meals or foods. These include toast or cereal.  6 hours before the procedure - stop drinking milk or drinks that have milk in them.  2 hours before the procedure - stop drinking clear liquids. General instructions  Take off any jewelry.  Ask your doctor about changing or stopping your normal medicines. This is important if you take diabetes medicines or blood thinners. What happens during the procedure?  You will lie on a table with your arms above your head.  An IV tube may be put into one of your veins.  Dye may be put into the IV tube. You may feel warm or have a metal taste in your mouth.  The table you will be lying on will move into the CT scanner.  You will be able to see, hear, and talk to the person who is running the machine while you are in it. Follow that person's directions.  The machine will move around you to take pictures. Do not move.  When the machine is done taking pictures, it will be turned off.  The table will be moved out of the machine.  Your IV tube will be taken out. The procedure may vary among doctors and hospitals. What happens after the procedure?  It is up to you to get your results. Ask when your results will be ready. Summary  A CT scan is a kind of X-ray.  A CT scan makes pictures of the inside of your body.  Follow instructions from your doctor about eating and drinking before the procedure.  You will be able to see, hear, and talk to the person who is running the machine  while you are in it. Follow that person's directions. This information is not intended to replace advice given to you by your health care provider. Make sure you discuss any questions you have with your health care provider. Document Released: 12/05/2008 Document Revised: 09/25/2016 Document Reviewed: 09/25/2016 Elsevier Patient Education  [place].  Echocardiogram An echocardiogram is a procedure that uses painless sound waves (ultrasound) to produce an image of the heart. Images from an echocardiogram can provide important information about:  Signs of coronary artery disease (CAD).  Aneurysm detection. An aneurysm is a weak or damaged part of an artery wall that bulges out from the normal force of blood pumping through the body.  Heart size and shape. Changes in the size or shape of the heart can be associated with certain conditions, including heart failure, aneurysm, and CAD.  Heart muscle function.  Heart valve function.  Signs of a past heart attack.  Fluid buildup around the heart.  Thickening of the heart muscle.  A tumor or infectious growth around the heart valves. Tell a health care provider about:  Any allergies you have.  All medicines you are taking, including vitamins, herbs, eye drops, creams, and over-the-counter medicines.  Any blood disorders you have.  Any surgeries you have had.  Any medical conditions you have.  Whether you are pregnant or may be pregnant. What are the risks? Generally, this is a safe procedure. However, problems may occur, including:  Allergic reaction to dye (contrast) that may be used during the procedure. What happens before the procedure? No specific preparation is needed. You may eat and drink normally. What happens during the procedure?   An IV tube may be inserted into one of your veins.  You may receive contrast through this tube. A contrast is an injection that improves the quality of the pictures from your heart.  A gel will be applied to your chest.  A wand-like tool (transducer) will be moved over your chest. The gel will help to transmit the sound waves from the transducer.  The sound waves will harmlessly bounce off of your heart to allow the heart images to be captured in real-time motion. The images will be recorded on a computer. The  procedure may vary among health care providers and hospitals. What happens after the procedure?  You may return to your normal, everyday life, including diet, activities, and medicines, unless your health care provider tells you not to do that. Summary  An echocardiogram is a procedure that uses painless sound waves (ultrasound) to produce an image of the heart.  Images from an echocardiogram can provide important information about the size and shape of your heart, heart muscle function, heart valve function, and fluid buildup around your heart.  You do not need to do anything to prepare before this procedure. You may eat and drink normally.  After the echocardiogram is completed, you may return to your normal, everyday life, unless your health care provider tells you not to do that. This information is not intended to replace advice given to you by your health care provider. Make sure you discuss any questions you have with your health care provider. Document Released: 09/05/2000 Document Revised: 12/30/2018 Document Reviewed: 10/11/2016 Elsevier Patient Education  2020 [place].  Cardiac Nuclear Scan A cardiac nuclear scan is a test that is done to check the flow of blood to your heart. It is done when you are resting and when you are exercising. The test looks for  problems such as:  Not enough blood reaching a portion of the heart.  The heart muscle not working as it should. You may need this test if:  You have heart disease.  You have had lab results that are not normal.  You have had heart surgery or a balloon procedure to open up blocked arteries (angioplasty).  You have chest pain.  You have shortness of breath. In this test, a special dye (tracer) is put into your bloodstream. The tracer will travel to your heart. A camera will then take pictures of your heart to see how the tracer moves through your heart. This test is usually done at a hospital and takes 2-4  hours. Tell a doctor about:  Any allergies you have.  All medicines you are taking, including vitamins, herbs, eye drops, creams, and over-the-counter medicines.  Any problems you or family members have had with anesthetic medicines.  Any blood disorders you have.  Any surgeries you have had.  Any medical conditions you have.  Whether you are pregnant or may be pregnant. What are the risks? Generally, this is a safe test. However, problems may occur, such as:  Serious chest pain and heart attack. This is only a risk if the stress portion of the test is done.  Rapid heartbeat.  A feeling of warmth in your chest. This feeling usually does not last long.  Allergic reaction to the tracer. What happens before the test?  Ask your doctor about changing or stopping your normal medicines. This is important.  Follow instructions from your doctor about what you cannot eat or drink.  Remove your jewelry on the day of the test. What happens during the test?  An IV tube will be inserted into one of your veins.  Your doctor will give you a small amount of tracer through the IV tube.  You will wait for 20-40 minutes while the tracer moves through your bloodstream.  Your heart will be monitored with an electrocardiogram (ECG).  You will lie down on an exam table.  Pictures of your heart will be taken for about 15-20 minutes.  You may also have a stress test. For this test, one of these things may be done: ? You will be asked to exercise on a treadmill or a stationary bike. ? You will be given medicines that will make your heart work harder. This is done if you are unable to exercise.  When blood flow to your heart has peaked, a tracer will again be given through the IV tube.  After 20-40 minutes, you will get back on the exam table. More pictures will be taken of your heart.  Depending on the tracer that is used, more pictures may need to be taken 3-4 hours later.  Your IV  tube will be removed when the test is over. The test may vary among doctors and hospitals. What happens after the test?  Ask your doctor: ? Whether you can return to your normal schedule, including diet, activities, and medicines. ? Whether you should drink more fluids. This will help to remove the tracer from your body. Drink enough fluid to keep your pee (urine) pale yellow.  Ask your doctor, or the department that is doing the test: ? When will my results be ready? ? How will I get my results? Summary  A cardiac nuclear scan is a test that is done to check the flow of blood to your heart.  Tell your doctor whether you are pregnant  or may be pregnant.  Before the test, ask your doctor about changing or stopping your normal medicines. This is important.  Ask your doctor whether you can return to your normal activities. You may be asked to drink more fluids. This information is not intended to replace advice given to you by your health care provider. Make sure you discuss any questions you have with your health care provider. Document Released: 02/22/2018 Document Revised: 12/29/2018 Document Reviewed: 02/22/2018 Elsevier Patient Education  [place].  Sleep Studies A sleep study (polysomnogram) is a series of tests done while you are sleeping. A sleep study records your brain waves, heart rate, breathing rate, oxygen level, and eye and leg movements. A sleep study helps your health care provider:  See how well you sleep.  Diagnose a sleep disorder.  Determine how severe your sleep disorder is.  Create a plan to treat your sleep disorder. Your health care provider may recommend a sleep study if you:  Feel sleepy on most days.  Snore loudly while sleeping.  Have unusual behaviors while you sleep, such as walking.  Have brief periods in which you stop breathing during sleep (sleepapnea).  Fall asleep suddenly during the day (narcolepsy).  Have trouble falling  asleep or staying asleep (insomnia).  Feel like you need to move your legs when trying to fall asleep (restless legs syndrome).  Move your legs by flexing and extending them regularly while asleep (periodic limb movement disorder).  Act out your dreams while you sleep (sleep behavior disorder).  Feel like you cannot move when you first wake up (sleep paralysis). What tests are part of a sleep study? Most sleep studies record the following during sleep:  Brain activity.  Eye movements.  Heart rate and rhythm.  Breathing rate and rhythm.  Blood-oxygen level.  Blood pressure.  Chest and belly movement as you breathe.  Arm and leg movements.  Snoring or other noises.  Body position. Where are sleep studies done? Sleep studies are done at sleep centers. A sleep center may be inside a hospital, office, or clinic. The room where you have the study may look like a hospital room or a hotel room. The health care providers doing the study may come in and out of the room during the study. Most of the time, they will be in another room monitoring your test as you sleep. How are sleep studies done? Most sleep studies are done during a normal period of time for a full night of sleep. You will arrive at the study center in the evening and go home in the morning. Before the test  Bring your pajamas and toothbrush with you to the sleep study.  Do not have caffeine on the day of your sleep study.  Do not drink alcohol on the day of your sleep study.  Your health care provider will let you know if you should stop taking any of your regular medicines before the test. During the test      Round, sticky patches with sensors attached to recording wires (electrodes) are placed on your scalp, face, chest, and limbs.  Wires from all the electrodes and sensors run from your bed to a computer. The wires can be taken off and put back on if you need to get out of bed to go to the bathroom.  A  sensor is placed over your nose to measure airflow.  A finger clip is put on your finger or ear to measure your blood  oxygen level (pulse oximetry).  A belt is placed around your belly and a belt is placed around your chest to measure breathing movements.  If you have signs of the sleep disorder called sleep apnea during your test, you may get a treatment mask to wear for the second half of the night. ? The mask provides positive airway pressure (PAP) to help you breathe better during sleep. This may greatly improve your sleep apnea. ? You will then have all tests done again with the mask in place to see if your measurements and recordings change. After the test  A medical doctor who specializes in sleep will evaluate the results of your sleep study and share them with you and your primary health care provider.  Based on your results, your medical history, and a physical exam, you may be diagnosed with a sleep disorder, such as: ? Sleep apnea. ? Restless legs syndrome. ? Sleep-related behavior disorder. ? Sleep-related movement disorders. ? Sleep-related seizure disorders.  Your health care team will help determine your treatment options based on your diagnosis. This may include: ? Improving your sleep habits (sleep hygiene). ? Wearing a continuous positive airway pressure (CPAP) or bi-level positive airway pressure (BPAP) mask. ? Wearing an oral device at night to improve breathing and reduce snoring. ? Taking medicines. Follow these instructions at home:  Take over-the-counter and prescription medicines only as told by your health care provider.  If you are instructed to use a CPAP or BPAP mask, make sure you use it nightly as directed.  Make any lifestyle changes that your health care provider recommends.  If you were given a device to open your airway while you sleep, use it only as told by your health care provider.  Do not use any tobacco products, such as cigarettes, chewing  tobacco, and e-cigarettes. If you need help quitting, ask your health care provider.  Keep all follow-up visits as told by your health care provider. This is important. Summary  A sleep study (polysomnogram) is a series of tests done while you are sleeping. It shows how well you sleep.  Most sleep studies are done over one full night of sleep. You will arrive at the study center in the evening and go home in the morning.  If you have signs of the sleep disorder called sleep apnea during your test, you may get a treatment mask to wear for the second half of the night.  A medical doctor who specializes in sleep will evaluate the results of your sleep study and share them with your primary health care provider. This information is not intended to replace advice given to you by your health care provider. Make sure you discuss any questions you have with your health care provider. Document Released: 03/15/2003 Document Revised: 06/25/2018 Document Reviewed: 10/06/2017 Elsevier Patient Education  2020 [place], Donato Heinz, MD  09/09/2019 10:55 AM    [place]

## 2019-09-09 ENCOUNTER — Ambulatory Visit (INDEPENDENT_AMBULATORY_CARE_PROVIDER_SITE_OTHER): Payer: BC Managed Care – PPO | Admitting: Cardiology

## 2019-09-09 ENCOUNTER — Encounter: Payer: Self-pay | Admitting: Cardiology

## 2019-09-09 ENCOUNTER — Other Ambulatory Visit: Payer: Self-pay

## 2019-09-09 VITALS — BP 148/84 | HR 83 | Ht 66.0 in | Wt 340.2 lb

## 2019-09-09 DIAGNOSIS — Z13220 Encounter for screening for lipoid disorders: Secondary | ICD-10-CM | POA: Diagnosis not present

## 2019-09-09 DIAGNOSIS — M7989 Other specified soft tissue disorders: Secondary | ICD-10-CM | POA: Diagnosis not present

## 2019-09-09 DIAGNOSIS — R0602 Shortness of breath: Secondary | ICD-10-CM | POA: Diagnosis not present

## 2019-09-09 DIAGNOSIS — R079 Chest pain, unspecified: Secondary | ICD-10-CM

## 2019-09-09 DIAGNOSIS — I10 Essential (primary) hypertension: Secondary | ICD-10-CM

## 2019-09-09 DIAGNOSIS — R0683 Snoring: Secondary | ICD-10-CM | POA: Diagnosis not present

## 2019-09-09 DIAGNOSIS — R918 Other nonspecific abnormal finding of lung field: Secondary | ICD-10-CM

## 2019-09-09 NOTE — Addendum Note (Signed)
Addended by: Alvina Filbert B on: 09/09/2019 01:26 PM   Modules accepted: Orders

## 2019-09-09 NOTE — Patient Instructions (Addendum)
Medication Instructions:  Your physician recommends that you continue on your current medications as directed. Please refer to the Current Medication list given to you today.  *If you need a refill on your cardiac medications before your next appointment, please call your pharmacy*  Lab Work: LP/CMET/BNP TODAY   COVID SCREENING 3 DAYS PRIOR TO SLEEP STUDY  TO BE DONE AT [facility]   If you have labs (blood work) drawn today and your tests are completely normal, you will receive your results only by: Marland Kitchen MyChart Message (if you have MyChart) OR . A paper copy in the mail If you have any lab test that is abnormal or we need to change your treatment, we will call you to review the results.  Testing/Procedures: Your physician has requested that you have an echocardiogram. Echocardiography is a painless test that uses sound waves to create images of your heart. It provides your doctor with information about the size and shape of your heart and how well your heart's chambers and valves are working. This procedure takes approximately one hour. There are no restrictions for this procedure.  Your physician has requested that you have a lexiscan myoview. For further information please visit [email or web address]. Please follow instruction sheet, as given.  BOTH OF THE ABOVE AT [address]  Your physician has requested that you have a lower or upper extremity venous duplex. This test is an ultrasound of the veins in the legs or arms. It looks at venous blood flow that carries blood from the heart to the legs or arms. Allow one hour for a Lower Venous exam. Allow thirty minutes for an Upper Venous exam. There are no restrictions or special instructions. LOWER EXTREMITY   Your physician has recommended that you have a sleep study. This test records several body functions during sleep, including: brain activity, eye movement, oxygen and carbon dioxide blood levels,  heart rate and rhythm, breathing rate and rhythm, the flow of air through your mouth and nose, snoring, body muscle movements, and chest and belly movement. THE OFFICE WILL CALL YOU TO SCHEDULE ONCE YOUR INSURANCE HAS APPROVED   Non-Cardiac CT scanning, (CAT scanning), is a noninvasive, special x-ray that produces cross-sectional images of the body using x-rays and a computer. CT scans help physicians diagnose and treat medical conditions. For some CT exams, a contrast material is used to enhance visibility in the area of the body being studied. CT scans provide greater clarity and reveal more details than regular x-ray exams.  Follow-Up: At [facility], you and your health needs are our priority.  As part of our continuing mission to provide you with exceptional heart care, we have created designated Provider Care Teams.  These Care Teams include your primary Cardiologist (physician) and Advanced Practice Providers (APPs -  Physician Assistants and Nurse Practitioners) who all work together to provide you with the care you need, when you need it.  Your next appointment:   6-8 WEEKS IN OFFICE WITH DR [facility]  Other Instructions  MONITOR AND LOG YOUR BLOOD PRESSURE DAILY FOR 2 WEEKS CALL THE OFFICE WITH READINGS [phone]  CT Scan  A CT scan is a kind of X-ray. A CT scan makes pictures of the inside of your body. In this procedure, the pictures will be taken in a large machine that has an opening (CT scanner). What happens before the procedure? Staying hydrated Follow instructions from your doctor about hydration, which may include:  Up to  2 hours before the procedure - you may continue to drink clear liquids. These include water, clear fruit juice, black coffee, and plain tea. Eating and drinking restrictions Follow instructions from your doctor about eating and drinking, which may include:  24 hours before the procedure - stop drinking caffeinated drinks. These include energy  drinks, tea, soda, coffee, and hot chocolate.  8 hours before the procedure - stop eating heavy meals or foods. These include meat, fried foods, or fatty foods.  6 hours before the procedure - stop eating light meals or foods. These include toast or cereal.  6 hours before the procedure - stop drinking milk or drinks that have milk in them.  2 hours before the procedure - stop drinking clear liquids. General instructions  Take off any jewelry.  Ask your doctor about changing or stopping your normal medicines. This is important if you take diabetes medicines or blood thinners. What happens during the procedure?  You will lie on a table with your arms above your head.  An IV tube may be put into one of your veins.  Dye may be put into the IV tube. You may feel warm or have a metal taste in your mouth.  The table you will be lying on will move into the CT scanner.  You will be able to see, hear, and talk to the person who is running the machine while you are in it. Follow that person's directions.  The machine will move around you to take pictures. Do not move.  When the machine is done taking pictures, it will be turned off.  The table will be moved out of the machine.  Your IV tube will be taken out. The procedure may vary among doctors and hospitals. What happens after the procedure?  It is up to you to get your results. Ask when your results will be ready. Summary  A CT scan is a kind of X-ray.  A CT scan makes pictures of the inside of your body.  Follow instructions from your doctor about eating and drinking before the procedure.  You will be able to see, hear, and talk to the person who is running the machine while you are in it. Follow that person's directions. This information is not intended to replace advice given to you by your health care provider. Make sure you discuss any questions you have with your health care provider. Document Released: 12/05/2008  Document Revised: 09/25/2016 Document Reviewed: 09/25/2016 Elsevier Patient Education  [place].  Echocardiogram An echocardiogram is a procedure that uses painless sound waves (ultrasound) to produce an image of the heart. Images from an echocardiogram can provide important information about:  Signs of coronary artery disease (CAD).  Aneurysm detection. An aneurysm is a weak or damaged part of an artery wall that bulges out from the normal force of blood pumping through the body.  Heart size and shape. Changes in the size or shape of the heart can be associated with certain conditions, including heart failure, aneurysm, and CAD.  Heart muscle function.  Heart valve function.  Signs of a past heart attack.  Fluid buildup around the heart.  Thickening of the heart muscle.  A tumor or infectious growth around the heart valves. Tell a health care provider about:  Any allergies you have.  All medicines you are taking, including vitamins, herbs, eye drops, creams, and over-the-counter medicines.  Any blood disorders you have.  Any surgeries you have had.  Any medical  conditions you have.  Whether you are pregnant or may be pregnant. What are the risks? Generally, this is a safe procedure. However, problems may occur, including:  Allergic reaction to dye (contrast) that may be used during the procedure. What happens before the procedure? No specific preparation is needed. You may eat and drink normally. What happens during the procedure?   An IV tube may be inserted into one of your veins.  You may receive contrast through this tube. A contrast is an injection that improves the quality of the pictures from your heart.  A gel will be applied to your chest.  A wand-like tool (transducer) will be moved over your chest. The gel will help to transmit the sound waves from the transducer.  The sound waves will harmlessly bounce off of your heart to allow the heart  images to be captured in real-time motion. The images will be recorded on a computer. The procedure may vary among health care providers and hospitals. What happens after the procedure?  You may return to your normal, everyday life, including diet, activities, and medicines, unless your health care provider tells you not to do that. Summary  An echocardiogram is a procedure that uses painless sound waves (ultrasound) to produce an image of the heart.  Images from an echocardiogram can provide important information about the size and shape of your heart, heart muscle function, heart valve function, and fluid buildup around your heart.  You do not need to do anything to prepare before this procedure. You may eat and drink normally.  After the echocardiogram is completed, you may return to your normal, everyday life, unless your health care provider tells you not to do that. This information is not intended to replace advice given to you by your health care provider. Make sure you discuss any questions you have with your health care provider. Document Released: 09/05/2000 Document Revised: 12/30/2018 Document Reviewed: 10/11/2016 Elsevier Patient Education  2020 [place].  Cardiac Nuclear Scan A cardiac nuclear scan is a test that is done to check the flow of blood to your heart. It is done when you are resting and when you are exercising. The test looks for problems such as:  Not enough blood reaching a portion of the heart.  The heart muscle not working as it should. You may need this test if:  You have heart disease.  You have had lab results that are not normal.  You have had heart surgery or a balloon procedure to open up blocked arteries (angioplasty).  You have chest pain.  You have shortness of breath. In this test, a special dye (tracer) is put into your bloodstream. The tracer will travel to your heart. A camera will then take pictures of your heart to see how the  tracer moves through your heart. This test is usually done at a hospital and takes 2-4 hours. Tell a doctor about:  Any allergies you have.  All medicines you are taking, including vitamins, herbs, eye drops, creams, and over-the-counter medicines.  Any problems you or family members have had with anesthetic medicines.  Any blood disorders you have.  Any surgeries you have had.  Any medical conditions you have.  Whether you are pregnant or may be pregnant. What are the risks? Generally, this is a safe test. However, problems may occur, such as:  Serious chest pain and heart attack. This is only a risk if the stress portion of the test is done.  Rapid heartbeat.  A feeling of warmth in your chest. This feeling usually does not last long.  Allergic reaction to the tracer. What happens before the test?  Ask your doctor about changing or stopping your normal medicines. This is important.  Follow instructions from your doctor about what you cannot eat or drink.  Remove your jewelry on the day of the test. What happens during the test?  An IV tube will be inserted into one of your veins.  Your doctor will give you a small amount of tracer through the IV tube.  You will wait for 20-40 minutes while the tracer moves through your bloodstream.  Your heart will be monitored with an electrocardiogram (ECG).  You will lie down on an exam table.  Pictures of your heart will be taken for about 15-20 minutes.  You may also have a stress test. For this test, one of these things may be done: ? You will be asked to exercise on a treadmill or a stationary bike. ? You will be given medicines that will make your heart work harder. This is done if you are unable to exercise.  When blood flow to your heart has peaked, a tracer will again be given through the IV tube.  After 20-40 minutes, you will get back on the exam table. More pictures will be taken of your heart.  Depending on the  tracer that is used, more pictures may need to be taken 3-4 hours later.  Your IV tube will be removed when the test is over. The test may vary among doctors and hospitals. What happens after the test?  Ask your doctor: ? Whether you can return to your normal schedule, including diet, activities, and medicines. ? Whether you should drink more fluids. This will help to remove the tracer from your body. Drink enough fluid to keep your pee (urine) pale yellow.  Ask your doctor, or the department that is doing the test: ? When will my results be ready? ? How will I get my results? Summary  A cardiac nuclear scan is a test that is done to check the flow of blood to your heart.  Tell your doctor whether you are pregnant or may be pregnant.  Before the test, ask your doctor about changing or stopping your normal medicines. This is important.  Ask your doctor whether you can return to your normal activities. You may be asked to drink more fluids. This information is not intended to replace advice given to you by your health care provider. Make sure you discuss any questions you have with your health care provider. Document Released: 02/22/2018 Document Revised: 12/29/2018 Document Reviewed: 02/22/2018 Elsevier Patient Education  [place].  Sleep Studies A sleep study (polysomnogram) is a series of tests done while you are sleeping. A sleep study records your brain waves, heart rate, breathing rate, oxygen level, and eye and leg movements. A sleep study helps your health care provider:  See how well you sleep.  Diagnose a sleep disorder.  Determine how severe your sleep disorder is.  Create a plan to treat your sleep disorder. Your health care provider may recommend a sleep study if you:  Feel sleepy on most days.  Snore loudly while sleeping.  Have unusual behaviors while you sleep, such as walking.  Have brief periods in which you stop breathing during sleep  (sleepapnea).  Fall asleep suddenly during the day (narcolepsy).  Have trouble falling asleep or staying asleep (insomnia).  Feel like you need  to move your legs when trying to fall asleep (restless legs syndrome).  Move your legs by flexing and extending them regularly while asleep (periodic limb movement disorder).  Act out your dreams while you sleep (sleep behavior disorder).  Feel like you cannot move when you first wake up (sleep paralysis). What tests are part of a sleep study? Most sleep studies record the following during sleep:  Brain activity.  Eye movements.  Heart rate and rhythm.  Breathing rate and rhythm.  Blood-oxygen level.  Blood pressure.  Chest and belly movement as you breathe.  Arm and leg movements.  Snoring or other noises.  Body position. Where are sleep studies done? Sleep studies are done at sleep centers. A sleep center may be inside a hospital, office, or clinic. The room where you have the study may look like a hospital room or a hotel room. The health care providers doing the study may come in and out of the room during the study. Most of the time, they will be in another room monitoring your test as you sleep. How are sleep studies done? Most sleep studies are done during a normal period of time for a full night of sleep. You will arrive at the study center in the evening and go home in the morning. Before the test  Bring your pajamas and toothbrush with you to the sleep study.  Do not have caffeine on the day of your sleep study.  Do not drink alcohol on the day of your sleep study.  Your health care provider will let you know if you should stop taking any of your regular medicines before the test. During the test      Round, sticky patches with sensors attached to recording wires (electrodes) are placed on your scalp, face, chest, and limbs.  Wires from all the electrodes and sensors run from your bed to a computer. The wires  can be taken off and put back on if you need to get out of bed to go to the bathroom.  A sensor is placed over your nose to measure airflow.  A finger clip is put on your finger or ear to measure your blood oxygen level (pulse oximetry).  A belt is placed around your belly and a belt is placed around your chest to measure breathing movements.  If you have signs of the sleep disorder called sleep apnea during your test, you may get a treatment mask to wear for the second half of the night. ? The mask provides positive airway pressure (PAP) to help you breathe better during sleep. This may greatly improve your sleep apnea. ? You will then have all tests done again with the mask in place to see if your measurements and recordings change. After the test  A medical doctor who specializes in sleep will evaluate the results of your sleep study and share them with you and your primary health care provider.  Based on your results, your medical history, and a physical exam, you may be diagnosed with a sleep disorder, such as: ? Sleep apnea. ? Restless legs syndrome. ? Sleep-related behavior disorder. ? Sleep-related movement disorders. ? Sleep-related seizure disorders.  Your health care team will help determine your treatment options based on your diagnosis. This may include: ? Improving your sleep habits (sleep hygiene). ? Wearing a continuous positive airway pressure (CPAP) or bi-level positive airway pressure (BPAP) mask. ? Wearing an oral device at night to improve breathing and reduce snoring. ? Taking  medicines. Follow these instructions at home:  Take over-the-counter and prescription medicines only as told by your health care provider.  If you are instructed to use a CPAP or BPAP mask, make sure you use it nightly as directed.  Make any lifestyle changes that your health care provider recommends.  If you were given a device to open your airway while you sleep, use it only as told by  your health care provider.  Do not use any tobacco products, such as cigarettes, chewing tobacco, and e-cigarettes. If you need help quitting, ask your health care provider.  Keep all follow-up visits as told by your health care provider. This is important. Summary  A sleep study (polysomnogram) is a series of tests done while you are sleeping. It shows how well you sleep.  Most sleep studies are done over one full night of sleep. You will arrive at the study center in the evening and go home in the morning.  If you have signs of the sleep disorder called sleep apnea during your test, you may get a treatment mask to wear for the second half of the night.  A medical doctor who specializes in sleep will evaluate the results of your sleep study and share them with your primary health care provider. This information is not intended to replace advice given to you by your health care provider. Make sure you discuss any questions you have with your health care provider. Document Released: 03/15/2003 Document Revised: 06/25/2018 Document Reviewed: 10/06/2017 Elsevier Patient Education  2020 [employer].

## 2019-09-10 LAB — LIPID PANEL
Chol/HDL Ratio: 2.7 ratio (ref 0.0–4.4)
Cholesterol, Total: 191 mg/dL (ref 100–199)
HDL: 72 mg/dL (ref >39)
LDL Chol Calc (NIH): 101 mg/dL — ABNORMAL HIGH (ref 0–99)
Triglycerides: 102 mg/dL (ref 0–149)
VLDL Cholesterol Cal: 18 mg/dL (ref 5–40)

## 2019-09-10 LAB — COMPREHENSIVE METABOLIC PANEL
ALT: 11 IU/L (ref 0–32)
AST: 11 IU/L (ref 0–40)
Albumin/Globulin Ratio: 1.3 (ref 1.2–2.2)
Albumin: 3.9 g/dL (ref 3.8–4.9)
Alkaline Phosphatase: 100 IU/L (ref 39–117)
BUN/Creatinine Ratio: 11 (ref 9–23)
BUN: 8 mg/dL (ref 6–24)
Bilirubin Total: 0.4 mg/dL (ref 0.0–1.2)
CO2: 21 mmol/L (ref 20–29)
Calcium: 9.3 mg/dL (ref 8.7–10.2)
Chloride: 102 mmol/L (ref 96–106)
Creatinine, Ser: 0.73 mg/dL (ref 0.57–1.00)
GFR calc Af Amer: 106 mL/min/{1.73_m2} (ref >59)
GFR calc non Af Amer: 92 mL/min/{1.73_m2} (ref >59)
Globulin, Total: 2.9 g/dL (ref 1.5–4.5)
Glucose: 74 mg/dL (ref 65–99)
Potassium: 3.8 mmol/L (ref 3.5–5.2)
Sodium: 140 mmol/L (ref 134–144)
Total Protein: 6.8 g/dL (ref 6.0–8.5)

## 2019-09-10 LAB — BRAIN NATRIURETIC PEPTIDE: BNP: 28.1 pg/mL (ref 0.0–100.0)

## 2019-09-14 ENCOUNTER — Telehealth: Payer: Self-pay | Admitting: *Deleted

## 2019-09-14 NOTE — Telephone Encounter (Signed)
Faxed PA for split night sleep study to [employer] [place] @[phone]. Pending reference # [id number].

## 2019-09-15 ENCOUNTER — Ambulatory Visit (HOSPITAL_COMMUNITY)
Admission: RE | Admit: 2019-09-15 | Discharge: 2019-09-15 | Disposition: A | Discharge status: Home / Self Care | Payer: BC Managed Care – PPO | Source: Ambulatory Visit | Attending: Cardiovascular Disease | Admitting: Cardiovascular Disease

## 2019-09-15 ENCOUNTER — Other Ambulatory Visit: Payer: Self-pay

## 2019-09-15 DIAGNOSIS — M7989 Other specified soft tissue disorders: Secondary | ICD-10-CM | POA: Diagnosis not present

## 2019-09-22 ENCOUNTER — Ambulatory Visit (INDEPENDENT_AMBULATORY_CARE_PROVIDER_SITE_OTHER)
Admission: RE | Admit: 2019-09-22 | Discharge: 2019-09-22 | Disposition: A | Discharge status: Home / Self Care | Payer: BC Managed Care – PPO | Source: Ambulatory Visit | Attending: Cardiology | Admitting: Cardiology

## 2019-09-22 ENCOUNTER — Other Ambulatory Visit: Payer: Self-pay

## 2019-09-22 DIAGNOSIS — R918 Other nonspecific abnormal finding of lung field: Secondary | ICD-10-CM

## 2019-09-27 ENCOUNTER — Other Ambulatory Visit: Payer: Self-pay

## 2019-09-27 DIAGNOSIS — R911 Solitary pulmonary nodule: Secondary | ICD-10-CM

## 2019-10-04 ENCOUNTER — Telehealth: Payer: Self-pay | Admitting: *Deleted

## 2019-10-04 ENCOUNTER — Other Ambulatory Visit: Payer: Self-pay | Admitting: Cardiology

## 2019-10-04 DIAGNOSIS — R0683 Snoring: Secondary | ICD-10-CM

## 2019-10-04 NOTE — Telephone Encounter (Signed)
Telephoned [employer] [place] and [facility] [place] [facility] to check request for date extension for HST. She informed me that HST actually doesn't require getting a PA. I was previously given approved DOS dates because the request was changed from a in lab test request to HST due to receiving a in lab denial.

## 2019-10-06 ENCOUNTER — Telehealth (HOSPITAL_COMMUNITY): Payer: Self-pay | Admitting: *Deleted

## 2019-10-06 NOTE — Telephone Encounter (Signed)
Left message on voicemail per DPR in reference to upcoming appointment scheduled on 10/12/19 with detailed instructions given per Myocardial Perfusion Study Information Sheet for the test. LM to arrive 15 minutes early, and that it is imperative to arrive on time for appointment to keep from having the test rescheduled. If you need to cancel or reschedule your appointment, please call the office within 24 hours of your appointment. Failure to do so may result in a cancellation of your appointment, and a $50 no show fee. Phone number given for call back for any questions.  Kirstie Peri

## 2019-10-11 DIAGNOSIS — D259 Leiomyoma of uterus, unspecified: Secondary | ICD-10-CM | POA: Diagnosis not present

## 2019-10-12 ENCOUNTER — Ambulatory Visit (HOSPITAL_COMMUNITY): Payer: BC Managed Care – PPO | Attending: Cardiovascular Disease

## 2019-10-12 ENCOUNTER — Ambulatory Visit (HOSPITAL_BASED_OUTPATIENT_CLINIC_OR_DEPARTMENT_OTHER): Payer: BC Managed Care – PPO

## 2019-10-12 ENCOUNTER — Other Ambulatory Visit: Payer: Self-pay

## 2019-10-12 DIAGNOSIS — R079 Chest pain, unspecified: Secondary | ICD-10-CM

## 2019-10-12 DIAGNOSIS — R0602 Shortness of breath: Secondary | ICD-10-CM | POA: Diagnosis not present

## 2019-10-12 LAB — ECHOCARDIOGRAM COMPLETE
Height: 66 in
Weight: 5440 oz

## 2019-10-12 MED ORDER — TECHNETIUM TC 99M TETROFOSMIN IV KIT
32.4000 | PACK | Freq: Once | INTRAVENOUS | Status: AC | PRN
  Administered 2019-10-12: 32.4 via INTRAVENOUS
  Filled 2019-10-12: qty 33

## 2019-10-12 MED ORDER — REGADENOSON 0.4 MG/5ML IV SOLN
0.4000 mg | Freq: Once | INTRAVENOUS | Status: AC
  Administered 2019-10-12: 0.4 mg via INTRAVENOUS

## 2019-10-13 ENCOUNTER — Ambulatory Visit (HOSPITAL_COMMUNITY): Payer: BC Managed Care – PPO | Attending: Cardiology

## 2019-10-13 LAB — MYOCARDIAL PERFUSION IMAGING
LV dias vol: 82 mL (ref 46–106)
LV sys vol: 41 mL
Peak HR: 105 {beats}/min
Rest HR: 68 {beats}/min
SDS: 1
SRS: 0
SSS: 1
TID: 0.97

## 2019-10-13 MED ORDER — TECHNETIUM TC 99M TETROFOSMIN IV KIT
31.5000 | PACK | Freq: Once | INTRAVENOUS | Status: AC | PRN
  Administered 2019-10-13: 31.5 via INTRAVENOUS
  Filled 2019-10-13: qty 32

## 2019-11-02 ENCOUNTER — Other Ambulatory Visit: Payer: Self-pay

## 2019-11-02 ENCOUNTER — Telehealth: Payer: Self-pay | Admitting: Family Medicine

## 2019-11-02 MED ORDER — ALBUTEROL SULFATE HFA 108 (90 BASE) MCG/ACT IN AERS: 2.0000 | INHALATION_SPRAY | RESPIRATORY_TRACT | 1 refills | Status: DC | PRN

## 2019-11-02 MED ORDER — ALBUTEROL SULFATE (2.5 MG/3ML) 0.083% IN NEBU: 2.5000 mg | INHALATION_SOLUTION | Freq: Four times a day (QID) | RESPIRATORY_TRACT | 1 refills | Status: DC | PRN

## 2019-11-02 NOTE — Telephone Encounter (Signed)
Rx sent 

## 2019-11-02 NOTE — Telephone Encounter (Signed)
Pt is needing a refill on albuterol inhaler 108 MCG and is out of this medication.  Pharm: [facility]

## 2019-11-25 ENCOUNTER — Ambulatory Visit: Payer: BC Managed Care – PPO | Admitting: Cardiology

## 2019-12-02 ENCOUNTER — Encounter (HOSPITAL_BASED_OUTPATIENT_CLINIC_OR_DEPARTMENT_OTHER): Payer: BC Managed Care – PPO | Admitting: Cardiovascular Disease

## 2019-12-12 NOTE — Progress Notes (Signed)
Cardiology Office Note:    Date:  12/14/2019   ID:  Laurie Peters, DOB 1961-03-31, MRN [id number]  PCP:  Billie Ruddy, MD  Cardiologist:  No primary care provider on file.  Electrophysiologist:  None   Referring MD: Billie Ruddy, MD   Chief Complaint  Patient presents with  . Leg Swelling    History of Present Illness:    Laurie Peters is a 59 y.o. female with a hx of hypertension, asthma who presents for follow-up.  She was referred by Dr. Mancel Bale for an evaluation of lower extremity edema, initially seen on 09/09/2019.  Laurie Peters reports that she has had swelling in her legs for years.  States that she was on steroids for bleeding due to uterine fibroids which led to significant weight gain.  Steroids have been discontinued, fibroids were removed and the bleeding has stopped.  She does not exercise regularly.  States she works from home and sits for 8-9 hours per day.  Reports when she does go for walks that she gets chest tightness which she describes as substernal.  Reports BP usually well controlled at home has been 130s over 70s.  Reports brother had MI 18s, another brother had surgery to repair a hole in his heart and then also had an MI in 81s.  Father died of stroke in 82s.  No smoking history.  She does have history of asthma, but reports it is well controlled, needs to use rescue inhaler less than once per month.  Labs 09/09/19 showed normal BNP, normal albumin, and normal renal function.  Lower extremity duplex on 09/15/2019 showed no DVT.  TTE on 10/12/2019 showed EF 60 to 65%, normal RV systolic function, no significant valvular disease.  Lexiscan Myoview was done on 10/13/19, which showed normal perfusion and EF 50%.  Was referred for sleep study.  Since last clinic visit, she reports that chest pain has resolved.  Still has LE edema.  Reports dyspnea has improved.  Started physical therapy today.  Reports BP has been well-controlled (130/70s at home).    Past  Medical History:  Diagnosis Date  . Asthma   . Heart murmur   . History of blood transfusion    after birth per patient  . Hypertension   . SVD (spontaneous vaginal delivery)    x 4  . Termination of pregnancy (fetus)    x 2  . Uterine fibroids affecting pregnancy, antepartum     Past Surgical History:  Procedure Laterality Date  . CHOLECYSTECTOMY N/A 06/15/2016   Procedure: LAPAROSCOPIC CHOLECYSTECTOMY;  Surgeon: Coralie Keens, MD;  Location: [facility];  Service: General;  Laterality: N/A;  . DILITATION & CURRETTAGE/HYSTROSCOPY WITH HYDROTHERMAL ABLATION N/A 07/08/2018   Procedure: DILATATION & CURETTAGE/HYSTEROSCOPY WITH HYDROTHERMAL ABLATION;  Surgeon: Everett Graff, MD;  Location: [facility];  Service: Gynecology;  Laterality: N/A;  . WISDOM TOOTH EXTRACTION     only 2 removed    Current Medications: Current Meds  Medication Sig  . albuterol (PROVENTIL) (2.5 MG/3ML) 0.083% nebulizer solution Take 3 mLs (2.5 mg total) by nebulization every 6 (six) hours as needed for wheezing or shortness of breath.  Marland Kitchen albuterol (VENTOLIN HFA) 108 (90 Base) MCG/ACT inhaler Inhale 2 puffs into the lungs every 4 (four) hours as needed for wheezing or shortness of breath.  . Ascorbic Acid (VITAMIN C) 1000 MG tablet Take 1,000 mg by mouth daily.  . chlorthalidone (HYGROTON) 25 MG tablet Take 1 tablet (25 mg total) by mouth daily.  Marland Kitchen  Cholecalciferol (VITAMIN D3) 50 MCG (2000 UT) TABS Take 1 tablet by mouth daily.  Marland Kitchen levothyroxine (SYNTHROID) 125 MCG tablet Take 1 tablet (125 mcg total) by mouth daily.  . vitamin B-12 (CYANOCOBALAMIN) 50 MCG tablet Take 50 mcg by mouth daily.     Allergies:   Theraflu cold & [phenylephrine-pheniramine-dm]   Social History   Socioeconomic History  . Marital status: Single    Spouse name: Not on file  . Number of children: 4  . Years of education: Not on file  . Highest education level: Not on file  Occupational History  . Not on file  Tobacco Use  . Smoking  status: Never Smoker  . Smokeless tobacco: Never Used  Substance and Sexual Activity  . Alcohol use: Yes    Comment: wine twice a month  . Drug use: No  . Sexual activity: Not Currently    Birth control/protection: None  Other Topics Concern  . Not on file  Social History Narrative  . Not on file   Social Determinants of Health   Financial Resource Strain:   . Difficulty of Paying Living Expenses:   Food Insecurity:   . Worried About Charity fundraiser in the Last Year:   . Arboriculturist in the Last Year:   Transportation Needs:   . Film/video editor (Medical):   Marland Kitchen Lack of Transportation (Non-Medical):   Physical Activity:   . Days of Exercise per Week:   . Minutes of Exercise per Session:   Stress:   . Feeling of Stress :   Social Connections:   . Frequency of Communication with Friends and Family:   . Frequency of Social Gatherings with Friends and Family:   . Attends Religious Services:   . Active Member of Clubs or Organizations:   . Attends Archivist Meetings:   Marland Kitchen Marital Status:      Family History: The patient's family history includes Asthma in her sister and son; Cancer in her paternal grandmother and sister; Diabetes in her brother; Heart disease in her brother, brother, brother, father, maternal grandmother, and paternal grandmother; Hyperlipidemia in her maternal grandmother and paternal grandmother; Hypertension in her father and mother; Stroke in her father, maternal grandmother, and paternal grandfather.  ROS:   Please see the history of present illness.     All other systems reviewed and are negative.  EKGs/Labs/Other Studies Reviewed:    The following studies were reviewed today:   EKG:  EKG is not ordered today.  The ekg ordered most recently demonstrates normal sinus rhythm, rate 66, no ST/T abnormalities  LE duplex 09/15/19: Right: No evidence of deep vein thrombosis in the lower extremity. No  indirect evidence of  obstruction proximal to the inguinal ligament. No  evidence of chronic venous insufficiency is detected in the great  saphenous vein. No cystic structure found in  the popliteal fossa. Limited visualization of the calf veins due to body  habitus and swelling. Specifically, the tibioperoneal confluence and  popliteal vein are without thrombus.   Left: No evidence of deep vein thrombosis in the lower extremity. No  indirect evidence of obstruction proximal to the inguinal ligament. No  evidence of chronic venous insufficiency is detected in the great  saphenous vein. No cystic structure found in the  popliteal fossa. Limited visualization of the calf veins due to body  habitus and swelling. Specifically, the tibioperoneal confluence and  popliteal vein are without thrombus     [employer]  10/13/2019:  Nuclear stress EF: 50%. No wall motion abnormalities  There was no ST segment deviation noted during stress.  The study is normal. No perfusion defects no ischemia  This is a low risk study.  TTE 10/12/19: 1. Left ventricular ejection fraction, by visual estimation, is 60 to  65%. The left ventricle has normal function. There is no left ventricular  hypertrophy.  2. The left ventricle has no regional wall motion abnormalities.  3. Normal GLS -18.2.  4. Global right ventricle has normal systolic function.The right  ventricular size is normal. No increase in right ventricular wall  thickness.  5. Left atrial size was mildly dilated.  6. Right atrial size was normal.  7. The mitral valve is normal in structure. Trivial mitral valve  regurgitation. No evidence of mitral stenosis.  8. The tricuspid valve is normal in structure.  9. The tricuspid valve is normal in structure. Tricuspid valve  regurgitation is trivial.  10. The aortic valve is tricuspid. Aortic valve regurgitation is not  visualized. Mild aortic valve sclerosis without stenosis.  11. The pulmonic valve  was normal in structure. Pulmonic valve  regurgitation is not visualized.  12. The inferior vena cava is normal in size with greater than 50%  respiratory variability, suggesting right atrial pressure of 3 mmHg.   CT chest 09/22/19: 1. The right middle lobe pulmonary nodule appears more solid and well-defined today, but this difference is favored to be due to motion on the prior exam. The nodule is incompletely imaged on the 06/14/2016 CT, but felt to be relatively similar. Therefore, recommend chest CT follow-up at 6 months. 2. Other primarily similar pulmonary nodules, including a tiny ground-glass right middle lobe nodule as detailed above. Recommend attention on follow-up. 3.  No acute process in the chest. 4.  Aortic Atherosclerosis (ICD10-I70.0).  Recent Labs: 08/12/2019: TSH 3.38 09/09/2019: ALT 11; BNP 28.1; BUN 8; Creatinine, Ser 0.73; Potassium 3.8; Sodium 140  Recent Lipid Panel    Component Value Date/Time   CHOL 191 09/09/2019 1011   TRIG 102 09/09/2019 1011   HDL 72 09/09/2019 1011   CHOLHDL 2.7 09/09/2019 1011   LDLCALC 101 (H) 09/09/2019 1011    Physical Exam:    VS:  BP (!) 144/71   Pulse 86   Ht 5\' 6"  (1.676 m)   Wt (!) 351 lb 6.4 oz (159.4 kg)   LMP  (LMP Unknown) Comment: Ablation  SpO2 99%   BMI 56.72 kg/m     Wt Readings from Last 3 Encounters:  12/14/19 (!) 351 lb 6.4 oz (159.4 kg)  10/12/19 (!) 340 lb (154.2 kg)  09/09/19 (!) 340 lb 3.2 oz (154.3 kg)     GEN:  in no acute distress HEENT: Normal NECK: No JVD appreciated but difficult to assess given body habitus LYMPHATICS: No lymphadenopathy CARDIAC: RRR, no murmurs, rubs, gallops.  Distant heart sounds RESPIRATORY:  Clear to auscultation without rales, wheezing or rhonchi  ABDOMEN: Soft, non-tender, non-distended MUSCULOSKELETAL: non-pitting bilateral lower extremity edema SKIN: Warm and dry NEUROLOGIC:  Alert and oriented x 3 PSYCHIATRIC:  Normal affect   ASSESSMENT:    1. Chest  pain of uncertain etiology   2. Swelling of lower extremity   3. Essential hypertension   4. Pulmonary nodule   5. Snoring    PLAN:     Chest pain: Description concerning for typical angina, as describes chest tightness with exertion that resolves with rest.  However Lexiscan Myoview 10/12/2018 showed no evidence of ischemia.  Reports no recent chest pain.  Lower extremity edema: No evidence of structural heart disease on TTE 10/12/2019.  LE duplex 09/15/19 shows no evidence of DVT.  Normal albumin.  Suspect lymphedema due to obesity.  Encouraged diet/exercise and weight loss.  Hypertension: On chlorthalidone 25 mg daily.  Mildly elevated in clinic today, though reports has been under good control when she checks it at home  Pulmonary nodule: 13 mm nodule in right middle lobe on CT chest 11/05/2017.  Measured 8 mm in 2017 abdominal CT but was partially covered at that time.  Repeat CT chest 09/22/2019 shows right middle lobe pulmonary nodule appears more solid and well-defined thought to be secondary to motion on the prior exam, repeat imaging at 6 months recommended -Repeat CT chest in June 2021  Snoring: Concern for OSA, sleep study pending  Cardiovascular risk assessment: LDL 101 on 09/09/2019.  ASCVD risk score 4.3%.  No indication for statin at this time  Hypothyroidism: Follows with endocrinology, on levothyroxine  RTC in 6 months  Medication Adjustments/Labs and Tests Ordered: Current medicines are reviewed at length with the patient today.  Concerns regarding medicines are outlined above.  No orders of the defined types were placed in this encounter.  No orders of the defined types were placed in this encounter.   Patient Instructions  Medication Instructions:  Your physician recommends that you continue on your current medications as directed. Please refer to the Current Medication list given to you today.  *If you need a refill on your cardiac medications before your next  appointment, please call your pharmacy*  Lab Work: NONE  Testing/Procedures: NONE  Follow-Up: At [facility], you and your health needs are our priority.  As part of our continuing mission to provide you with exceptional heart care, we have created designated Provider Care Teams.  These Care Teams include your primary Cardiologist (physician) and Advanced Practice Providers (APPs -  Physician Assistants and Nurse Practitioners) who all work together to provide you with the care you need, when you need it.  We recommend signing up for the patient portal called "MyChart".  Sign up information is provided on this After Visit Summary.  MyChart is used to connect with patients for Virtual Visits (Telemedicine).  Patients are able to view lab/test results, encounter notes, upcoming appointments, etc.  Non-urgent messages can be sent to your provider as well.   To learn more about what you can do with MyChart, go to [email or web address].    Your next appointment:   6 month(s) (Call in June/July to schedule)  The format for your next appointment:   In Person  Provider:   Oswaldo Milian, MD        Signed, Donato Heinz, MD  12/14/2019 12:25 PM    [place]

## 2019-12-14 ENCOUNTER — Ambulatory Visit (INDEPENDENT_AMBULATORY_CARE_PROVIDER_SITE_OTHER): Payer: BC Managed Care – PPO | Admitting: Cardiology

## 2019-12-14 ENCOUNTER — Other Ambulatory Visit: Payer: Self-pay

## 2019-12-14 ENCOUNTER — Encounter: Payer: Self-pay | Admitting: Cardiology

## 2019-12-14 VITALS — BP 144/71 | HR 86 | Ht 66.0 in | Wt 351.4 lb

## 2019-12-14 DIAGNOSIS — M9903 Segmental and somatic dysfunction of lumbar region: Secondary | ICD-10-CM | POA: Diagnosis not present

## 2019-12-14 DIAGNOSIS — M9904 Segmental and somatic dysfunction of sacral region: Secondary | ICD-10-CM | POA: Diagnosis not present

## 2019-12-14 DIAGNOSIS — M25552 Pain in left hip: Secondary | ICD-10-CM | POA: Diagnosis not present

## 2019-12-14 DIAGNOSIS — R0683 Snoring: Secondary | ICD-10-CM

## 2019-12-14 DIAGNOSIS — M9905 Segmental and somatic dysfunction of pelvic region: Secondary | ICD-10-CM | POA: Diagnosis not present

## 2019-12-14 DIAGNOSIS — I10 Essential (primary) hypertension: Secondary | ICD-10-CM

## 2019-12-14 DIAGNOSIS — M7989 Other specified soft tissue disorders: Secondary | ICD-10-CM

## 2019-12-14 DIAGNOSIS — M9901 Segmental and somatic dysfunction of cervical region: Secondary | ICD-10-CM | POA: Diagnosis not present

## 2019-12-14 DIAGNOSIS — M25651 Stiffness of right hip, not elsewhere classified: Secondary | ICD-10-CM | POA: Diagnosis not present

## 2019-12-14 DIAGNOSIS — R079 Chest pain, unspecified: Secondary | ICD-10-CM

## 2019-12-14 DIAGNOSIS — R911 Solitary pulmonary nodule: Secondary | ICD-10-CM | POA: Diagnosis not present

## 2019-12-14 DIAGNOSIS — M25652 Stiffness of left hip, not elsewhere classified: Secondary | ICD-10-CM | POA: Diagnosis not present

## 2019-12-14 DIAGNOSIS — M25551 Pain in right hip: Secondary | ICD-10-CM | POA: Diagnosis not present

## 2019-12-14 NOTE — Patient Instructions (Signed)
Medication Instructions:  Your physician recommends that you continue on your current medications as directed. Please refer to the Current Medication list given to you today.  *If you need a refill on your cardiac medications before your next appointment, please call your pharmacy*  Lab Work: NONE  Testing/Procedures: NONE  Follow-Up: At [facility], you and your health needs are our priority.  As part of our continuing mission to provide you with exceptional heart care, we have created designated Provider Care Teams.  These Care Teams include your primary Cardiologist (physician) and Advanced Practice Providers (APPs -  Physician Assistants and Nurse Practitioners) who all work together to provide you with the care you need, when you need it.  We recommend signing up for the patient portal called "MyChart".  Sign up information is provided on this After Visit Summary.  MyChart is used to connect with patients for Virtual Visits (Telemedicine).  Patients are able to view lab/test results, encounter notes, upcoming appointments, etc.  Non-urgent messages can be sent to your provider as well.   To learn more about what you can do with MyChart, go to [email or web address].    Your next appointment:   6 month(s) (Call in June/July to schedule)  The format for your next appointment:   In Person  Provider:   Oswaldo Milian, MD

## 2019-12-15 DIAGNOSIS — M25651 Stiffness of right hip, not elsewhere classified: Secondary | ICD-10-CM | POA: Diagnosis not present

## 2019-12-15 DIAGNOSIS — M25552 Pain in left hip: Secondary | ICD-10-CM | POA: Diagnosis not present

## 2019-12-15 DIAGNOSIS — M25652 Stiffness of left hip, not elsewhere classified: Secondary | ICD-10-CM | POA: Diagnosis not present

## 2019-12-15 DIAGNOSIS — M25551 Pain in right hip: Secondary | ICD-10-CM | POA: Diagnosis not present

## 2019-12-16 ENCOUNTER — Ambulatory Visit: Payer: BC Managed Care – PPO | Attending: Internal Medicine

## 2019-12-16 DIAGNOSIS — Z23 Encounter for immunization: Secondary | ICD-10-CM

## 2019-12-16 NOTE — Progress Notes (Signed)
   Covid-19 Vaccination Clinic  Name:  Laurie Peters    MRN: [id number] DOB: March 12, 1961  12/16/2019  Laurie Peters was observed post Covid-19 immunization for 15 minutes without incident. She was provided with Vaccine Information Sheet and instruction to access the V-Safe system.   Laurie Peters was instructed to call 911 with any severe reactions post vaccine: Marland Kitchen Difficulty breathing  . Swelling of face and throat  . A fast heartbeat  . A bad rash all over body  . Dizziness and weakness   Immunizations Administered    Name Date Dose VIS Date Route   Pfizer COVID-19 Vaccine 12/16/2019  4:30 PM 0.3 mL 09/02/2019 Intramuscular   Manufacturer: [place]   Lot: [id number]   [place]: [id number]

## 2019-12-20 DIAGNOSIS — M25552 Pain in left hip: Secondary | ICD-10-CM | POA: Diagnosis not present

## 2019-12-20 DIAGNOSIS — M25651 Stiffness of right hip, not elsewhere classified: Secondary | ICD-10-CM | POA: Diagnosis not present

## 2019-12-20 DIAGNOSIS — M25652 Stiffness of left hip, not elsewhere classified: Secondary | ICD-10-CM | POA: Diagnosis not present

## 2019-12-20 DIAGNOSIS — M25551 Pain in right hip: Secondary | ICD-10-CM | POA: Diagnosis not present

## 2019-12-21 DIAGNOSIS — M9903 Segmental and somatic dysfunction of lumbar region: Secondary | ICD-10-CM | POA: Diagnosis not present

## 2019-12-21 DIAGNOSIS — M9901 Segmental and somatic dysfunction of cervical region: Secondary | ICD-10-CM | POA: Diagnosis not present

## 2019-12-21 DIAGNOSIS — M9904 Segmental and somatic dysfunction of sacral region: Secondary | ICD-10-CM | POA: Diagnosis not present

## 2019-12-21 DIAGNOSIS — M9905 Segmental and somatic dysfunction of pelvic region: Secondary | ICD-10-CM | POA: Diagnosis not present

## 2019-12-22 DIAGNOSIS — M25651 Stiffness of right hip, not elsewhere classified: Secondary | ICD-10-CM | POA: Diagnosis not present

## 2019-12-22 DIAGNOSIS — M9903 Segmental and somatic dysfunction of lumbar region: Secondary | ICD-10-CM | POA: Diagnosis not present

## 2019-12-22 DIAGNOSIS — M25652 Stiffness of left hip, not elsewhere classified: Secondary | ICD-10-CM | POA: Diagnosis not present

## 2019-12-22 DIAGNOSIS — M9904 Segmental and somatic dysfunction of sacral region: Secondary | ICD-10-CM | POA: Diagnosis not present

## 2019-12-22 DIAGNOSIS — M9905 Segmental and somatic dysfunction of pelvic region: Secondary | ICD-10-CM | POA: Diagnosis not present

## 2019-12-22 DIAGNOSIS — M9901 Segmental and somatic dysfunction of cervical region: Secondary | ICD-10-CM | POA: Diagnosis not present

## 2019-12-22 DIAGNOSIS — M25551 Pain in right hip: Secondary | ICD-10-CM | POA: Diagnosis not present

## 2019-12-22 DIAGNOSIS — M25552 Pain in left hip: Secondary | ICD-10-CM | POA: Diagnosis not present

## 2019-12-23 DIAGNOSIS — M25552 Pain in left hip: Secondary | ICD-10-CM | POA: Diagnosis not present

## 2019-12-23 DIAGNOSIS — M25551 Pain in right hip: Secondary | ICD-10-CM | POA: Diagnosis not present

## 2019-12-23 DIAGNOSIS — M25652 Stiffness of left hip, not elsewhere classified: Secondary | ICD-10-CM | POA: Diagnosis not present

## 2019-12-23 DIAGNOSIS — M25651 Stiffness of right hip, not elsewhere classified: Secondary | ICD-10-CM | POA: Diagnosis not present

## 2019-12-26 DIAGNOSIS — M25652 Stiffness of left hip, not elsewhere classified: Secondary | ICD-10-CM | POA: Diagnosis not present

## 2019-12-26 DIAGNOSIS — M25651 Stiffness of right hip, not elsewhere classified: Secondary | ICD-10-CM | POA: Diagnosis not present

## 2019-12-26 DIAGNOSIS — M25552 Pain in left hip: Secondary | ICD-10-CM | POA: Diagnosis not present

## 2019-12-26 DIAGNOSIS — M9904 Segmental and somatic dysfunction of sacral region: Secondary | ICD-10-CM | POA: Diagnosis not present

## 2019-12-26 DIAGNOSIS — M9901 Segmental and somatic dysfunction of cervical region: Secondary | ICD-10-CM | POA: Diagnosis not present

## 2019-12-26 DIAGNOSIS — M25551 Pain in right hip: Secondary | ICD-10-CM | POA: Diagnosis not present

## 2019-12-26 DIAGNOSIS — M9903 Segmental and somatic dysfunction of lumbar region: Secondary | ICD-10-CM | POA: Diagnosis not present

## 2019-12-26 DIAGNOSIS — M9905 Segmental and somatic dysfunction of pelvic region: Secondary | ICD-10-CM | POA: Diagnosis not present

## 2019-12-29 DIAGNOSIS — M25551 Pain in right hip: Secondary | ICD-10-CM | POA: Diagnosis not present

## 2019-12-29 DIAGNOSIS — M25651 Stiffness of right hip, not elsewhere classified: Secondary | ICD-10-CM | POA: Diagnosis not present

## 2019-12-29 DIAGNOSIS — M25652 Stiffness of left hip, not elsewhere classified: Secondary | ICD-10-CM | POA: Diagnosis not present

## 2019-12-29 DIAGNOSIS — M9903 Segmental and somatic dysfunction of lumbar region: Secondary | ICD-10-CM | POA: Diagnosis not present

## 2019-12-29 DIAGNOSIS — M9901 Segmental and somatic dysfunction of cervical region: Secondary | ICD-10-CM | POA: Diagnosis not present

## 2019-12-29 DIAGNOSIS — M25552 Pain in left hip: Secondary | ICD-10-CM | POA: Diagnosis not present

## 2019-12-29 DIAGNOSIS — M9904 Segmental and somatic dysfunction of sacral region: Secondary | ICD-10-CM | POA: Diagnosis not present

## 2019-12-29 DIAGNOSIS — M9905 Segmental and somatic dysfunction of pelvic region: Secondary | ICD-10-CM | POA: Diagnosis not present

## 2019-12-30 DIAGNOSIS — M25551 Pain in right hip: Secondary | ICD-10-CM | POA: Diagnosis not present

## 2019-12-30 DIAGNOSIS — M25652 Stiffness of left hip, not elsewhere classified: Secondary | ICD-10-CM | POA: Diagnosis not present

## 2019-12-30 DIAGNOSIS — M25552 Pain in left hip: Secondary | ICD-10-CM | POA: Diagnosis not present

## 2019-12-30 DIAGNOSIS — M25651 Stiffness of right hip, not elsewhere classified: Secondary | ICD-10-CM | POA: Diagnosis not present

## 2020-01-02 DIAGNOSIS — M9903 Segmental and somatic dysfunction of lumbar region: Secondary | ICD-10-CM | POA: Diagnosis not present

## 2020-01-02 DIAGNOSIS — M9905 Segmental and somatic dysfunction of pelvic region: Secondary | ICD-10-CM | POA: Diagnosis not present

## 2020-01-02 DIAGNOSIS — M9904 Segmental and somatic dysfunction of sacral region: Secondary | ICD-10-CM | POA: Diagnosis not present

## 2020-01-02 DIAGNOSIS — M9901 Segmental and somatic dysfunction of cervical region: Secondary | ICD-10-CM | POA: Diagnosis not present

## 2020-01-03 DIAGNOSIS — M25652 Stiffness of left hip, not elsewhere classified: Secondary | ICD-10-CM | POA: Diagnosis not present

## 2020-01-03 DIAGNOSIS — M25552 Pain in left hip: Secondary | ICD-10-CM | POA: Diagnosis not present

## 2020-01-03 DIAGNOSIS — M25651 Stiffness of right hip, not elsewhere classified: Secondary | ICD-10-CM | POA: Diagnosis not present

## 2020-01-03 DIAGNOSIS — M25551 Pain in right hip: Secondary | ICD-10-CM | POA: Diagnosis not present

## 2020-01-04 DIAGNOSIS — M9904 Segmental and somatic dysfunction of sacral region: Secondary | ICD-10-CM | POA: Diagnosis not present

## 2020-01-04 DIAGNOSIS — M9903 Segmental and somatic dysfunction of lumbar region: Secondary | ICD-10-CM | POA: Diagnosis not present

## 2020-01-04 DIAGNOSIS — M9901 Segmental and somatic dysfunction of cervical region: Secondary | ICD-10-CM | POA: Diagnosis not present

## 2020-01-04 DIAGNOSIS — M9905 Segmental and somatic dysfunction of pelvic region: Secondary | ICD-10-CM | POA: Diagnosis not present

## 2020-01-05 DIAGNOSIS — M9905 Segmental and somatic dysfunction of pelvic region: Secondary | ICD-10-CM | POA: Diagnosis not present

## 2020-01-05 DIAGNOSIS — M9901 Segmental and somatic dysfunction of cervical region: Secondary | ICD-10-CM | POA: Diagnosis not present

## 2020-01-05 DIAGNOSIS — M25552 Pain in left hip: Secondary | ICD-10-CM | POA: Diagnosis not present

## 2020-01-05 DIAGNOSIS — M9903 Segmental and somatic dysfunction of lumbar region: Secondary | ICD-10-CM | POA: Diagnosis not present

## 2020-01-05 DIAGNOSIS — M9904 Segmental and somatic dysfunction of sacral region: Secondary | ICD-10-CM | POA: Diagnosis not present

## 2020-01-05 DIAGNOSIS — M25651 Stiffness of right hip, not elsewhere classified: Secondary | ICD-10-CM | POA: Diagnosis not present

## 2020-01-05 DIAGNOSIS — M25652 Stiffness of left hip, not elsewhere classified: Secondary | ICD-10-CM | POA: Diagnosis not present

## 2020-01-05 DIAGNOSIS — M25551 Pain in right hip: Secondary | ICD-10-CM | POA: Diagnosis not present

## 2020-01-09 DIAGNOSIS — M9901 Segmental and somatic dysfunction of cervical region: Secondary | ICD-10-CM | POA: Diagnosis not present

## 2020-01-09 DIAGNOSIS — M9904 Segmental and somatic dysfunction of sacral region: Secondary | ICD-10-CM | POA: Diagnosis not present

## 2020-01-09 DIAGNOSIS — M9905 Segmental and somatic dysfunction of pelvic region: Secondary | ICD-10-CM | POA: Diagnosis not present

## 2020-01-09 DIAGNOSIS — M9903 Segmental and somatic dysfunction of lumbar region: Secondary | ICD-10-CM | POA: Diagnosis not present

## 2020-01-11 ENCOUNTER — Ambulatory Visit: Payer: BC Managed Care – PPO | Attending: Internal Medicine

## 2020-01-11 DIAGNOSIS — Z23 Encounter for immunization: Secondary | ICD-10-CM

## 2020-01-11 NOTE — Progress Notes (Signed)
   Covid-19 Vaccination Clinic  Name:  Persephanie Brimberry    MRN: [id number] DOB: [birth year]  01/11/2020  Ms. Beck was observed post Covid-19 immunization for 15 minutes without incident. She was provided with Vaccine Information Sheet and instruction to access the V-Safe system.   Ms. Grumbling was instructed to call 911 with any severe reactions post vaccine: Marland Kitchen Difficulty breathing  . Swelling of face and throat  . A fast heartbeat  . A bad rash all over body  . Dizziness and weakness   Immunizations Administered    Name Date Dose VIS Date Route   Pfizer COVID-19 Vaccine 01/11/2020  4:55 PM 0.3 mL 11/16/2018 Intramuscular   Manufacturer: [place]   Lot: [id number]   [place]: [id number]

## 2020-01-17 DIAGNOSIS — M9903 Segmental and somatic dysfunction of lumbar region: Secondary | ICD-10-CM | POA: Diagnosis not present

## 2020-01-17 DIAGNOSIS — M9905 Segmental and somatic dysfunction of pelvic region: Secondary | ICD-10-CM | POA: Diagnosis not present

## 2020-01-17 DIAGNOSIS — M9904 Segmental and somatic dysfunction of sacral region: Secondary | ICD-10-CM | POA: Diagnosis not present

## 2020-01-17 DIAGNOSIS — M9901 Segmental and somatic dysfunction of cervical region: Secondary | ICD-10-CM | POA: Diagnosis not present

## 2020-01-18 DIAGNOSIS — M9905 Segmental and somatic dysfunction of pelvic region: Secondary | ICD-10-CM | POA: Diagnosis not present

## 2020-01-18 DIAGNOSIS — M9903 Segmental and somatic dysfunction of lumbar region: Secondary | ICD-10-CM | POA: Diagnosis not present

## 2020-01-18 DIAGNOSIS — M9904 Segmental and somatic dysfunction of sacral region: Secondary | ICD-10-CM | POA: Diagnosis not present

## 2020-01-18 DIAGNOSIS — M9901 Segmental and somatic dysfunction of cervical region: Secondary | ICD-10-CM | POA: Diagnosis not present

## 2020-01-19 DIAGNOSIS — M9903 Segmental and somatic dysfunction of lumbar region: Secondary | ICD-10-CM | POA: Diagnosis not present

## 2020-01-19 DIAGNOSIS — M9901 Segmental and somatic dysfunction of cervical region: Secondary | ICD-10-CM | POA: Diagnosis not present

## 2020-01-19 DIAGNOSIS — M9905 Segmental and somatic dysfunction of pelvic region: Secondary | ICD-10-CM | POA: Diagnosis not present

## 2020-01-19 DIAGNOSIS — M9904 Segmental and somatic dysfunction of sacral region: Secondary | ICD-10-CM | POA: Diagnosis not present

## 2020-01-23 DIAGNOSIS — M9904 Segmental and somatic dysfunction of sacral region: Secondary | ICD-10-CM | POA: Diagnosis not present

## 2020-01-23 DIAGNOSIS — M9905 Segmental and somatic dysfunction of pelvic region: Secondary | ICD-10-CM | POA: Diagnosis not present

## 2020-01-23 DIAGNOSIS — M9903 Segmental and somatic dysfunction of lumbar region: Secondary | ICD-10-CM | POA: Diagnosis not present

## 2020-01-23 DIAGNOSIS — M9901 Segmental and somatic dysfunction of cervical region: Secondary | ICD-10-CM | POA: Diagnosis not present

## 2020-01-25 DIAGNOSIS — M9905 Segmental and somatic dysfunction of pelvic region: Secondary | ICD-10-CM | POA: Diagnosis not present

## 2020-01-25 DIAGNOSIS — M9904 Segmental and somatic dysfunction of sacral region: Secondary | ICD-10-CM | POA: Diagnosis not present

## 2020-01-25 DIAGNOSIS — M9901 Segmental and somatic dysfunction of cervical region: Secondary | ICD-10-CM | POA: Diagnosis not present

## 2020-01-25 DIAGNOSIS — M9903 Segmental and somatic dysfunction of lumbar region: Secondary | ICD-10-CM | POA: Diagnosis not present

## 2020-02-08 DIAGNOSIS — Z20822 Contact with and (suspected) exposure to covid-19: Secondary | ICD-10-CM | POA: Diagnosis not present

## 2020-02-09 ENCOUNTER — Encounter (HOSPITAL_BASED_OUTPATIENT_CLINIC_OR_DEPARTMENT_OTHER): Payer: BC Managed Care – PPO | Admitting: Cardiovascular Disease

## 2020-03-19 ENCOUNTER — Telehealth: Payer: Self-pay | Admitting: *Deleted

## 2020-03-19 NOTE — Telephone Encounter (Signed)
Spoke with patient regarding appointment for CT chest scheduled  04/06/20 at 2:45 pm at [facility]---arrival time is 2:30pm 1st floor admissions office----patient voiced her understanding.

## 2020-04-06 ENCOUNTER — Ambulatory Visit (HOSPITAL_COMMUNITY): Payer: BC Managed Care – PPO

## 2020-04-09 ENCOUNTER — Telehealth: Payer: Self-pay | Admitting: Cardiology

## 2020-04-09 NOTE — Telephone Encounter (Signed)
Left message for patient to call to discuss rescheduling CT chest that was cancelled on 04/06/20

## 2020-04-19 ENCOUNTER — Telehealth: Payer: Self-pay | Admitting: *Deleted

## 2020-04-19 NOTE — Telephone Encounter (Signed)
A detailed message was left, re: follow up visit.

## 2020-04-20 ENCOUNTER — Ambulatory Visit: Payer: BC Managed Care – PPO | Admitting: Family Medicine

## 2020-04-20 DIAGNOSIS — Z113 Encounter for screening for infections with a predominantly sexual mode of transmission: Secondary | ICD-10-CM | POA: Diagnosis not present

## 2020-04-20 DIAGNOSIS — B356 Tinea cruris: Secondary | ICD-10-CM | POA: Diagnosis not present

## 2020-04-20 DIAGNOSIS — L918 Other hypertrophic disorders of the skin: Secondary | ICD-10-CM | POA: Diagnosis not present

## 2020-04-20 DIAGNOSIS — L0232 Furuncle of buttock: Secondary | ICD-10-CM | POA: Diagnosis not present

## 2020-04-25 DIAGNOSIS — L0292 Furuncle, unspecified: Secondary | ICD-10-CM | POA: Diagnosis not present

## 2020-04-25 DIAGNOSIS — B009 Herpesviral infection, unspecified: Secondary | ICD-10-CM | POA: Diagnosis not present

## 2020-05-08 ENCOUNTER — Telehealth: Payer: Self-pay | Admitting: Cardiology

## 2020-05-08 NOTE — Telephone Encounter (Signed)
Spoke with patient to schedule follow up visit with Gardiner Rhyme from recall list but patient stated they didn't want to make appointment at this time

## 2020-05-24 NOTE — Progress Notes (Signed)
Name: Laurie Peters  MRN/ DOB: [id number], 04-Feb-1961    Age/ Sex: 59 y.o., female     PCP: Billie Ruddy, MD   Reason for Endocrinology Evaluation: 07/22/2018     Initial Endocrinology Clinic Visit: Hypothyroidism     PATIENT IDENTIFIER: Laurie Peters is a 59 y.o., female with a past medical history of menorrhagia and anemia secondary to fibroids, HTN, and Asthma  . She has followed with [facility] since 07/22/2018 for consultative assistance with management of her hypothyroidism    HISTORICAL SUMMARY: The patient was first diagnosed with abnormal thyroid function tests in 2017, she was c/o fatigue at the time. This was not investigated again until 05/2018.   She was started on LT-4 replacement in 06/2018  Mother with hypothyroidism  SUBJECTIVE:   Today (05/25/2020):  Ms. Laurie Peters is here for a 6 month follow up on hypothyroidism. She has been compliant with LT-4 replacement. She takes appropriately on an empty stomach, at least 30 minutes before breakfast.     Pt has had intentional   No local neck symptoms  She has noted slight improvement in energy level, denies constipation, depression.  She denies local neck symptoms.      ROS:  As per HPI.   HISTORY:  Past Medical History:  Past Medical History:  Diagnosis Date  . Asthma   . Heart murmur   . History of blood transfusion    after birth per patient  . Hypertension   . SVD (spontaneous vaginal delivery)    x 4  . Termination of pregnancy (fetus)    x 2  . Uterine fibroids affecting pregnancy, antepartum    Past Surgical History:  Past Surgical History:  Procedure Laterality Date  . CHOLECYSTECTOMY N/A 06/15/2016   Procedure: LAPAROSCOPIC CHOLECYSTECTOMY;  Surgeon: Coralie Keens, MD;  Location: [facility];  Service: General;  Laterality: N/A;  . DILITATION & CURRETTAGE/HYSTROSCOPY WITH HYDROTHERMAL ABLATION N/A 07/08/2018   Procedure: DILATATION & CURETTAGE/HYSTEROSCOPY WITH  HYDROTHERMAL ABLATION;  Surgeon: Everett Graff, MD;  Location: [facility];  Service: Gynecology;  Laterality: N/A;  . WISDOM TOOTH EXTRACTION     only 2 removed    Social History:  reports that she has never smoked. She has never used smokeless tobacco. She reports current alcohol use. She reports that she does not use drugs. Family History:  Family History  Problem Relation Age of Onset  . Hypertension Mother   . Hypertension Father   . Heart disease Father   . Stroke Father   . Asthma Sister   . Asthma Son   . Hyperlipidemia Maternal Grandmother   . Heart disease Maternal Grandmother   . Stroke Maternal Grandmother   . Hyperlipidemia Paternal Grandmother   . Heart disease Paternal Grandmother   . Cancer Paternal Grandmother   . Stroke Paternal Grandfather   . Cancer Sister        uterus  . Heart disease Brother   . Heart disease Brother   . Diabetes Brother   . Heart disease Brother      HOME MEDICATIONS: Allergies as of 05/25/2020      Reactions   Theraflu Cold & [phenylephrine-pheniramine-dm] Hives      Medication List       Accurate as of May 25, 2020  8:53 AM. If you have any questions, ask your nurse or doctor.        albuterol 108 (90 Base) MCG/ACT inhaler Commonly known as: VENTOLIN HFA Inhale 2  puffs into the lungs every 4 (four) hours as needed for wheezing or shortness of breath.   albuterol (2.5 MG/3ML) 0.083% nebulizer solution Commonly known as: PROVENTIL Take 3 mLs (2.5 mg total) by nebulization every 6 (six) hours as needed for wheezing or shortness of breath.   chlorthalidone 25 MG tablet Commonly known as: HYGROTON Take 1 tablet (25 mg total) by mouth daily.   levothyroxine 125 MCG tablet Commonly known as: SYNTHROID Take 1 tablet (125 mcg total) by mouth daily.   vitamin B-12 50 MCG tablet Commonly known as: CYANOCOBALAMIN Take 50 mcg by mouth daily.   vitamin C 1000 MG tablet Take 1,000 mg by mouth daily.   Vitamin D3 50 MCG  (2000 UT) Tabs Take 1 tablet by mouth daily.         OBJECTIVE:   PHYSICAL EXAM: VS: BP 124/60 (BP Location: Left Arm, Patient Position: Sitting, Cuff Size: Large)   Pulse 72   Ht 5\' 6"  (1.676 m)   Wt (!) 334 lb (151.5 kg)   LMP  (LMP Unknown) Comment: Ablation  SpO2 98%   BMI 53.91 kg/m    EXAM: General: Pt appears well and is in NAD  Neck: General: Supple without adenopathy. Thyroid: Thyroid size normal.  No goiter or nodules appreciated. No thyroid bruit.  Lungs: Clear with good BS bilat with no rales, rhonchi, or wheezes  Heart: Auscultation: RRR.  Abdomen: Normoactive bowel sounds, soft, nontender, without masses or organomegaly palpable  Extremities:  BL LE: No pretibial edema normal ROM and strength.  Mental Status: Judgment, insight: Intact Orientation: Oriented to time, place, and person Mood and affect: No depression, anxiety, or agitation     DATA REVIEWED:  Results for BOSTON, CATARINO (MRN [id number]) as of 05/25/2020 12:46  Ref. Range 05/25/2020 08:00  TSH Latest Ref Range: 0.35 - 4.50 uIU/mL 1.70  T4,Free(Direct) Latest Ref Range: 0.60 - 1.60 ng/dL 1.03    ASSESSMENT / PLAN / RECOMMENDATIONS:   1. Hypothyroidism:   - I have praised the pt on weight loss, otherwise she is euthyroid - No local neck symptoms  -Pt educated extensively on the correct way to take levothyroxine (first thing in the morning with water, 30 minutes before eating or taking other medications). - Pt encouraged to double dose the following day if she were to miss a dose given long half-life of levothyroxine.    Medications  Continue  levothyroxine 125 MCG daily    Follow-up in 1 year   Addendum: Discussed results with pt on 05/26/19 @ 1315 Signed electronically by: Mack Guise, MD  [facility]  [facility] [address] [place], [place] [address] Phone: [phone] FAX: [phone]      CC: Billie Ruddy,  [place] [address] Phone: [phone]  Fax: [phone]   Return to Endocrinology clinic as below: No future appointments.

## 2020-05-25 ENCOUNTER — Encounter: Payer: Self-pay | Admitting: Internal Medicine

## 2020-05-25 ENCOUNTER — Other Ambulatory Visit: Payer: Self-pay

## 2020-05-25 ENCOUNTER — Ambulatory Visit (INDEPENDENT_AMBULATORY_CARE_PROVIDER_SITE_OTHER): Payer: BC Managed Care – PPO | Admitting: Internal Medicine

## 2020-05-25 VITALS — BP 124/60 | HR 72 | Ht 66.0 in | Wt 334.0 lb

## 2020-05-25 DIAGNOSIS — E039 Hypothyroidism, unspecified: Secondary | ICD-10-CM | POA: Diagnosis not present

## 2020-05-25 LAB — T4, FREE: Free T4: 1.03 ng/dL (ref 0.60–1.60)

## 2020-05-25 LAB — TSH: TSH: 1.7 u[IU]/mL (ref 0.35–4.50)

## 2020-05-25 MED ORDER — LEVOTHYROXINE SODIUM 125 MCG PO TABS: 125.0000 ug | ORAL_TABLET | Freq: Every day | ORAL | 3 refills | Status: DC

## 2020-05-25 NOTE — Patient Instructions (Signed)

## 2020-07-14 ENCOUNTER — Other Ambulatory Visit: Payer: Self-pay | Admitting: Family Medicine

## 2020-07-14 DIAGNOSIS — I10 Essential (primary) hypertension: Secondary | ICD-10-CM

## 2020-08-04 ENCOUNTER — Other Ambulatory Visit: Payer: Self-pay | Admitting: Family Medicine

## 2020-08-10 ENCOUNTER — Encounter: Payer: Self-pay | Admitting: Family Medicine

## 2020-08-10 ENCOUNTER — Telehealth (INDEPENDENT_AMBULATORY_CARE_PROVIDER_SITE_OTHER): Payer: BC Managed Care – PPO | Admitting: Family Medicine

## 2020-08-10 DIAGNOSIS — J4520 Mild intermittent asthma, uncomplicated: Secondary | ICD-10-CM

## 2020-08-10 MED ORDER — ALBUTEROL SULFATE HFA 108 (90 BASE) MCG/ACT IN AERS: 2.0000 | INHALATION_SPRAY | RESPIRATORY_TRACT | 4 refills | Status: AC | PRN

## 2020-08-10 NOTE — Progress Notes (Signed)
Virtual Visit via Video Note  I connected with Laurie Peters on 08/10/20 at 10:30 AM EST by a video enabled telemedicine application 2/2 [id number] pandemic and verified that I am speaking with the correct person using two identifiers.  Location patient: home Location provider:work or home office Persons participating in the virtual visit: patient, provider  I discussed the limitations of evaluation and management by telemedicine and the availability of in person appointments. The patient expressed understanding and agreed to proceed.   HPI: Pt notes more energy.  Feels like her thyroid medicine is working well.  Taking Synthroid 125 mcg. having some SOB when out on the weekends doing errands.  Pt using a floor exercise bike pedal when working from home as unable to get up and move around often as only has a few breaks throughout the day.  Pt requesting a refill on albuterol inhaler.  States breathing has been pretty good.  Not having to use the nebulizer much.  Taking D3, Vit C, B, and E vitamin supplements daily.  Eating fruit for snacks.  BP 130/79-80.  Taking chlorthalidone 25 mg daily.   ROS: See pertinent positives and negatives per HPI.  Past Medical History:  Diagnosis Date  . Asthma   . Heart murmur   . History of blood transfusion    after birth per patient  . Hypertension   . SVD (spontaneous vaginal delivery)    x 4  . Termination of pregnancy (fetus)    x 2  . Uterine fibroids affecting pregnancy, antepartum     Past Surgical History:  Procedure Laterality Date  . CHOLECYSTECTOMY N/A 06/15/2016   Procedure: LAPAROSCOPIC CHOLECYSTECTOMY;  Surgeon: Coralie Keens, MD;  Location: [place];  Service: General;  Laterality: N/A;  . DILITATION & CURRETTAGE/HYSTROSCOPY WITH HYDROTHERMAL ABLATION N/A 07/08/2018   Procedure: DILATATION & CURETTAGE/HYSTEROSCOPY WITH HYDROTHERMAL ABLATION;  Surgeon: Everett Graff, MD;  Location: [facility];  Service: Gynecology;  Laterality: N/A;  .  WISDOM TOOTH EXTRACTION     only 2 removed    Family History  Problem Relation Age of Onset  . Hypertension Mother   . Hypertension Father   . Heart disease Father   . Stroke Father   . Asthma Sister   . Asthma Son   . Hyperlipidemia Maternal Grandmother   . Heart disease Maternal Grandmother   . Stroke Maternal Grandmother   . Hyperlipidemia Paternal Grandmother   . Heart disease Paternal Grandmother   . Cancer Paternal Grandmother   . Stroke Paternal Grandfather   . Cancer Sister        uterus  . Heart disease Brother   . Heart disease Brother   . Diabetes Brother   . Heart disease Brother      Current Outpatient Medications:  .  albuterol (PROVENTIL) (2.5 MG/3ML) 0.083% nebulizer solution, Take 3 mLs (2.5 mg total) by nebulization every 6 (six) hours as needed for wheezing or shortness of breath., Disp: 150 mL, Rfl: 1 .  albuterol (VENTOLIN HFA) 108 (90 Base) MCG/ACT inhaler, Inhale 2 puffs into the lungs every 4 (four) hours as needed for wheezing or shortness of breath., Disp: 6.7 g, Rfl: 1 .  Ascorbic Acid (VITAMIN C) 1000 MG tablet, Take 1,000 mg by mouth daily., Disp: , Rfl:  .  chlorthalidone (HYGROTON) 25 MG tablet, Take 1 tablet by mouth once daily, Disp: 90 tablet, Rfl: 0 .  Cholecalciferol (VITAMIN D3) 50 MCG (2000 UT) TABS, Take 1 tablet by mouth daily., Disp: , Rfl:  .  levothyroxine (SYNTHROID) 125 MCG tablet, Take 1 tablet (125 mcg total) by mouth daily., Disp: 90 tablet, Rfl: 3 .  vitamin B-12 (CYANOCOBALAMIN) 50 MCG tablet, Take 50 mcg by mouth daily., Disp: , Rfl:   EXAM:  VITALS per patient if applicable:  GENERAL: alert, oriented, appears well and in no acute distress  HEENT: atraumatic, conjunctiva clear, no obvious abnormalities on inspection of external nose and ears  NECK: normal movements of the head and neck  LUNGS: on inspection no signs of respiratory distress, breathing rate appears normal, no obvious gross SOB, gasping or wheezing  CV:  no obvious cyanosis  MS: moves all visible extremities without noticeable abnormality  PSYCH/NEURO: pleasant and cooperative, no obvious depression or anxiety, speech and thought processing grossly intact  ASSESSMENT AND PLAN:  Discussed the following assessment and plan:  Mild intermittent asthma without complication -Controlled - Plan: albuterol (VENTOLIN HFA) 108 (90 Base) MCG/ACT inhaler  Follow-up as needed in office for CPE/labs in the next few months  I discussed the assessment and treatment plan with the patient. The patient was provided an opportunity to ask questions and all were answered. The patient agreed with the plan and demonstrated an understanding of the instructions.   The patient was advised to call back or seek an in-person evaluation if the symptoms worsen or if the condition fails to improve as anticipated.   Billie Ruddy, MD

## 2020-12-06 ENCOUNTER — Telehealth: Payer: BC Managed Care – PPO | Admitting: Family Medicine

## 2020-12-06 ENCOUNTER — Encounter: Payer: Self-pay | Admitting: Family Medicine

## 2020-12-06 DIAGNOSIS — J0100 Acute maxillary sinusitis, unspecified: Secondary | ICD-10-CM

## 2020-12-06 DIAGNOSIS — J069 Acute upper respiratory infection, unspecified: Secondary | ICD-10-CM | POA: Diagnosis not present

## 2020-12-06 MED ORDER — AZITHROMYCIN 250 MG PO TABS: ORAL_TABLET | ORAL | 0 refills | Status: DC

## 2020-12-06 MED ORDER — BENZONATATE 100 MG PO CAPS: 100.0000 mg | ORAL_CAPSULE | Freq: Three times a day (TID) | ORAL | 0 refills | Status: DC | PRN

## 2020-12-06 MED ORDER — FLUTICASONE PROPIONATE 50 MCG/ACT NA SUSP: 2.0000 | Freq: Every day | NASAL | 1 refills | Status: AC

## 2020-12-06 NOTE — Progress Notes (Signed)
Laurie Peters, Laurie Peters are scheduled for a virtual visit with your provider today.    Just as we do with appointments in the office, we must obtain your consent to participate.  Your consent will be active for this visit and any virtual visit you may have with one of our providers in the next 365 days.    If you have a MyChart account, I can also send a copy of this consent to you electronically.  All virtual visits are billed to your insurance company just like a traditional visit in the office.  As this is a virtual visit, video technology does not allow for your provider to perform a traditional examination.  This may limit your provider's ability to fully assess your condition.  If your provider identifies any concerns that need to be evaluated in person or the need to arrange testing such as labs, EKG, etc, we will make arrangements to do so.    Although advances in technology are sophisticated, we cannot ensure that it will always work on either your end or our end.  If the connection with a video visit is poor, we may have to switch to a telephone visit.  With either a video or telephone visit, we are not always able to ensure that we have a secure connection.   I need to obtain your verbal consent now.   Are you willing to proceed with your visit today?   Laurie Peters has provided verbal consent on 12/06/2020 for a virtual visit (video or telephone).   Laurie Mayo, NP 12/06/2020  9:23 AM   Date:  12/06/2020   ID:  Laurie Peters, DOB 1961/08/21, MRN [id number]  Patient Location: Home Provider Location: Home Office   Participants: Patient and Provider for Visit and Wrap up  Method of visit: Video  Location of Patient: Home Location of Provider: Home Office Consent was obtain for visit over the video. Services rendered by provider: Visit was performed via video  A video enabled telemedicine application was used and I verified that I am speaking with the correct person using two  identifiers.  PCP:  Laurie Ruddy, MD   Chief Complaint:  Sinus symptoms  History of Present Illness:    Laurie Peters is a 60 y.o. female with history as stated below. Presents video telehealth for an acute care visit secondary to sinus symptoms. The onset was last Friday when she started having runny nose, sinus pressure in eyes and maxillary and nose area. She developed cough- having to take her inhalers/nebs for her asthma. Mild congestion is chest. Asthma is better. But her sinus area has a lot of pressure. Denies chest pain. Mild shortness of breath with activity.  Yellow mucus and drainage. Reports sore throat and ear pain.  No exposure to Laurie Peters, yes. Tried using OTC mucinex cold med for HTN pt - no relief.  Past Medical, Surgical, Social History, Allergies, and Medications have been Reviewed.  Past Medical History:  Diagnosis Date  . Asthma   . Heart murmur   . History of blood transfusion    after birth per patient  . Hypertension   . SVD (spontaneous vaginal delivery)    x 4  . Termination of pregnancy (fetus)    x 2  . Uterine fibroids affecting pregnancy, antepartum     No outpatient medications have been marked as taking for the 12/06/20 encounter (Video Visit) with Laurie Mayo, NP.     Allergies:  Theraflu cold & [phenylephrine-pheniramine-dm]   ROS See HPI for history of present illness.  Physical Exam Constitutional:      Appearance: Normal appearance.  HENT:     Head: Normocephalic and atraumatic.     Right Ear: External ear normal.     Left Ear: External ear normal.     Nose: Nose normal.  Eyes:     Conjunctiva/sclera: Conjunctivae normal.  Pulmonary:     Comments: No shortness of breath or cough in conversation  Musculoskeletal:        General: Normal range of motion.     Cervical back: Normal range of motion.  Neurological:     Mental Status: She is alert and oriented to person, place, and time.  Psychiatric:         Mood and Affect: Mood normal.        Behavior: Behavior normal.        Thought Content: Thought content normal.        Judgment: Judgment normal.               A&P  1. Acute non-recurrent maxillary sinusitis -s&s are consistent with her having the onset of sinus infection.  -given timeframe and hx of asthma will start ABX  -OTC measures and other scripts for symptoms provided -hydration, rest, and completion of medication encouraged -Patient acknowledged agreement and understanding of the plan.      Time:   Today, I have spent 10 minutes with the patient with telehealth technology discussing the above problems, reviewing the chart, previous notes, medications and orders.    Tests Ordered: No orders of the defined types were placed in this encounter.   Medication Changes: No orders of the defined types were placed in this encounter.    Disposition:  Follow up as needed Signed, Laurie Mayo, NP  12/06/2020 9:23 AM

## 2020-12-06 NOTE — Patient Instructions (Signed)
I hope you feel better soon.  Please take medication as ordered and complete the zpak fully.  Rest, hydrate, and if not better after the zpack please call PCP to be seen in person.   Sinusitis, Adult Sinusitis is soreness and swelling (inflammation) of your sinuses. Sinuses are hollow spaces in the bones around your face. They are located:  Around your eyes.  In the middle of your forehead.  Behind your nose.  In your cheekbones. Your sinuses and nasal passages are lined with a fluid called mucus. Mucus drains out of your sinuses. Swelling can trap mucus in your sinuses. This lets germs (bacteria, virus, or fungus) grow, which leads to infection. Most of the time, this condition is caused by a virus. What are the causes? This condition is caused by:  Allergies.  Asthma.  Germs.  Things that block your nose or sinuses.  Growths in the nose (nasal polyps).  Chemicals or irritants in the air.  Fungus (rare). What increases the risk? You are more likely to develop this condition if:  You have a weak body defense system (immune system).  You do a lot of swimming or diving.  You use nasal sprays too much.  You smoke. What are the signs or symptoms? The main symptoms of this condition are pain and a feeling of pressure around the sinuses. Other symptoms include:  Stuffy nose (congestion).  Runny nose (drainage).  Swelling and warmth in the sinuses.  Headache.  Toothache.  A cough that may get worse at night.  Mucus that collects in the throat or the back of the nose (postnasal drip).  Being unable to smell and taste.  Being very tired (fatigue).  A fever.  Sore throat.  Bad breath. How is this diagnosed? This condition is diagnosed based on:  Your symptoms.  Your medical history.  A physical exam.  Tests to find out if your condition is short-term (acute) or long-term (chronic). Your doctor may: ? Check your nose for growths (polyps). ? Check  your sinuses using a tool that has a light (endoscope). ? Check for allergies or germs. ? Do imaging tests, such as an MRI or CT scan. How is this treated? Treatment for this condition depends on the cause and whether it is short-term or long-term.  If caused by a virus, your symptoms should go away on their own within 10 days. You may be given medicines to relieve symptoms. They include: ? Medicines that shrink swollen tissue in the nose. ? Medicines that treat allergies (antihistamines). ? A spray that treats swelling of the nostrils. ? Rinses that help get rid of thick mucus in your nose (nasal saline washes).  If caused by bacteria, your doctor may wait to see if you will get better without treatment. You may be given antibiotic medicine if you have: ? A very bad infection. ? A weak body defense system.  If caused by growths in the nose, you may need to have surgery. Follow these instructions at home: Medicines  Take, use, or apply over-the-counter and prescription medicines only as told by your doctor. These may include nasal sprays.  If you were prescribed an antibiotic medicine, take it as told by your doctor. Do not stop taking the antibiotic even if you start to feel better. Hydrate and humidify  Drink enough water to keep your pee (urine) pale yellow.  Use a cool mist humidifier to keep the humidity level in your home above 50%.  Breathe in steam for  10-15 minutes, 3-4 times a day, or as told by your doctor. You can do this in the bathroom while a hot shower is running.  Try not to spend time in cool or dry air.   Rest  Rest as much as you can.  Sleep with your head raised (elevated).  Make sure you get enough sleep each night. General instructions  Put a warm, moist washcloth on your face 3-4 times a day, or as often as told by your doctor. This will help with discomfort.  Wash your hands often with soap and water. If there is no soap and water, use hand  sanitizer.  Do not smoke. Avoid being around people who are smoking (secondhand smoke).  Keep all follow-up visits as told by your doctor. This is important.   Contact a doctor if:  You have a fever.  Your symptoms get worse.  Your symptoms do not get better within 10 days. Get help right away if:  You have a very bad headache.  You cannot stop throwing up (vomiting).  You have very bad pain or swelling around your face or eyes.  You have trouble seeing.  You feel confused.  Your neck is stiff.  You have trouble breathing. Summary  Sinusitis is swelling of your sinuses. Sinuses are hollow spaces in the bones around your face.  This condition is caused by tissues in your nose that become inflamed or swollen. This traps germs. These can lead to infection.  If you were prescribed an antibiotic medicine, take it as told by your doctor. Do not stop taking it even if you start to feel better.  Keep all follow-up visits as told by your doctor. This is important. This information is not intended to replace advice given to you by your health care provider. Make sure you discuss any questions you have with your health care provider. Document Revised: 02/08/2018 Document Reviewed: 02/08/2018 Elsevier Patient Education  2021 [employer].

## 2020-12-20 DIAGNOSIS — H6983 Other specified disorders of Eustachian tube, bilateral: Secondary | ICD-10-CM | POA: Diagnosis not present

## 2021-01-05 ENCOUNTER — Other Ambulatory Visit: Payer: Self-pay | Admitting: Family Medicine

## 2021-01-05 DIAGNOSIS — I10 Essential (primary) hypertension: Secondary | ICD-10-CM

## 2021-04-19 ENCOUNTER — Other Ambulatory Visit: Payer: Self-pay | Admitting: Family Medicine

## 2021-04-19 DIAGNOSIS — I10 Essential (primary) hypertension: Secondary | ICD-10-CM

## 2021-07-01 ENCOUNTER — Other Ambulatory Visit: Payer: Self-pay

## 2021-07-01 ENCOUNTER — Ambulatory Visit (INDEPENDENT_AMBULATORY_CARE_PROVIDER_SITE_OTHER): Payer: BC Managed Care – PPO | Admitting: Family Medicine

## 2021-07-01 ENCOUNTER — Encounter: Payer: Self-pay | Admitting: Family Medicine

## 2021-07-01 VITALS — BP 124/72 | HR 91 | Temp 98.4°F | Wt 345.6 lb

## 2021-07-01 DIAGNOSIS — R635 Abnormal weight gain: Secondary | ICD-10-CM

## 2021-07-01 DIAGNOSIS — E079 Disorder of thyroid, unspecified: Secondary | ICD-10-CM

## 2021-07-01 DIAGNOSIS — Z1211 Encounter for screening for malignant neoplasm of colon: Secondary | ICD-10-CM | POA: Diagnosis not present

## 2021-07-01 DIAGNOSIS — I10 Essential (primary) hypertension: Secondary | ICD-10-CM

## 2021-07-01 NOTE — Progress Notes (Signed)
Subjective:    Patient ID: Laurie Peters, female    DOB: 11/04/1960, 60 y.o.   MRN: [id number]  Chief Complaint  Patient presents with   Referral    Colostomy referral, would like lab work.     HPI Patient was seen today for f/u.  Pt last seen in person in 2019.  Patient states she would like a referral for colonoscopy and blood work to check cholesterol and thyroid.  Patient was previously seen by endocrinology and started on Synthroid 125 mcg.  Patient stopped taking Synthroid 2 months ago and she did not notice a difference in her fatigue and sleepy feeling.  Patient started taking an herbal thyroid medicine which seemed to give her energy.  BP at home 132/89.  Currently taking chlorthalidone.  States in the last stresses started a new job. Past Medical History:  Diagnosis Date   Asthma    Heart murmur    History of blood transfusion    after birth per patient   Hypertension    SVD (spontaneous vaginal delivery)    x 4   Termination of pregnancy (fetus)    x 2   Uterine fibroids affecting pregnancy, antepartum     Allergies  Allergen Reactions   Theraflu Cold & [Phenylephrine-Pheniramine-Dm] Hives    ROS General: Denies fever, chills, night sweats, changes in weight, changes in appetite HEENT: Denies headaches, ear pain, changes in vision, rhinorrhea, sore throat CV: Denies CP, palpitations, SOB, orthopnea Pulm: Denies SOB, cough, wheezing GI: Denies abdominal pain, nausea, vomiting, diarrhea, constipation GU: Denies dysuria, hematuria, frequency, vaginal discharge Msk: Denies muscle cramps, joint pains Neuro: Denies weakness, numbness, tingling Skin: Denies rashes, bruising Psych: Denies depression, anxiety, hallucinations     Objective:    Blood pressure 124/72, pulse 91, temperature 98.4 F (36.9 C), temperature source Oral, weight (!) 345 lb 9.6 oz (156.8 kg), SpO2 97 %.  Gen. Pleasant, well-nourished, obese, in no distress, normal affect   HEENT: [place]/AT,  face symmetric, conjunctiva clear, no scleral icterus, PERRLA, EOMI, nares patent without drainage. Neck: No JVD, no thyromegaly, no carotid bruits Lungs: no accessory muscle use, CTAB, no wheezes or rales Cardiovascular: RRR, no m/r/g, no peripheral edema Abdomen: BS present, soft, NT/ND Musculoskeletal: No deformities, no cyanosis or clubbing, normal tone Neuro:  A&Ox3, CN II-XII intact, ambulating with a cane. Skin:  Warm, no lesions/ rash   Wt Readings from Last 3 Encounters:  07/01/21 (!) 345 lb 9.6 oz (156.8 kg)  05/25/20 (!) 334 lb (151.5 kg)  12/14/19 (!) 351 lb 6.4 oz (159.4 kg)    Lab Results  Component Value Date   WBC 6.8 06/25/2018   HGB 14.0 06/25/2018   HCT 42.9 06/25/2018   PLT 460 (H) 06/25/2018   GLUCOSE 74 09/09/2019   CHOL 191 09/09/2019   TRIG 102 09/09/2019   HDL 72 09/09/2019   LDLCALC 101 (H) 09/09/2019   ALT 11 09/09/2019   AST 11 09/09/2019   NA 140 09/09/2019   K 3.8 09/09/2019   CL 102 09/09/2019   CREATININE 0.73 09/09/2019   BUN 8 09/09/2019   CO2 21 09/09/2019   TSH 1.70 05/25/2020    Assessment/Plan:  Essential hypertension -Controlled -Continue lifestyle modifications -Continue chlorthalidone 25 mg daily -Will obtain CMP  - Plan: Comprehensive metabolic panel  Thyroid dysfunction  -Per chart review TSH elevated in 2019 and 2020 as high as 16.76 with free T4 0.58 -Not currently on Rx medication -We will recheck levels and restart Synthroid  if needed - Plan: TSH, T4, Free  Weight gain -Discussed increasing physical activity by walking/increasing steps daily -Patient encouraged to drink plenty of water daily -We will obtain labs - Plan: Lipid panel, TSH, T4, Free, CBC with Differential/Platelet, Vitamin D, 25-hydroxy, Vitamin B12, Hemoglobin A1c  Colon cancer screening - Plan: Ambulatory referral to Gastroenterology  F/u in 2-3 months for CPE and HTN  Grier Mitts, MD

## 2021-07-02 ENCOUNTER — Other Ambulatory Visit (INDEPENDENT_AMBULATORY_CARE_PROVIDER_SITE_OTHER): Payer: BC Managed Care – PPO

## 2021-07-02 DIAGNOSIS — E079 Disorder of thyroid, unspecified: Secondary | ICD-10-CM | POA: Diagnosis not present

## 2021-07-02 DIAGNOSIS — R635 Abnormal weight gain: Secondary | ICD-10-CM | POA: Diagnosis not present

## 2021-07-02 DIAGNOSIS — I10 Essential (primary) hypertension: Secondary | ICD-10-CM

## 2021-07-02 LAB — COMPREHENSIVE METABOLIC PANEL
ALT: 20 U/L (ref 0–35)
AST: 22 U/L (ref 0–37)
Albumin: 4 g/dL (ref 3.5–5.2)
Alkaline Phosphatase: 94 U/L (ref 39–117)
BUN: 10 mg/dL (ref 6–23)
CO2: 27 mEq/L (ref 19–32)
Calcium: 9.6 mg/dL (ref 8.4–10.5)
Chloride: 99 mEq/L (ref 96–112)
Creatinine, Ser: 0.71 mg/dL (ref 0.40–1.20)
GFR: 93.6 mL/min (ref >60.00)
Glucose, Bld: 82 mg/dL (ref 70–99)
Potassium: 3.3 mEq/L — ABNORMAL LOW (ref 3.5–5.1)
Sodium: 137 mEq/L (ref 135–145)
Total Bilirubin: 0.4 mg/dL (ref 0.2–1.2)
Total Protein: 7.8 g/dL (ref 6.0–8.3)

## 2021-07-02 LAB — LIPID PANEL
Cholesterol: 192 mg/dL (ref 0–200)
HDL: 64.5 mg/dL (ref >39.00)
LDL Cholesterol: 99 mg/dL (ref 0–99)
NonHDL: 127.11
Total CHOL/HDL Ratio: 3
Triglycerides: 140 mg/dL (ref 0.0–149.0)
VLDL: 28 mg/dL (ref 0.0–40.0)

## 2021-07-02 LAB — CBC WITH DIFFERENTIAL/PLATELET
Basophils Absolute: 0.1 10*3/uL (ref 0.0–0.1)
Basophils Relative: 0.6 % (ref 0.0–3.0)
Eosinophils Absolute: 0.1 10*3/uL (ref 0.0–0.7)
Eosinophils Relative: 1.8 % (ref 0.0–5.0)
HCT: 39.9 % (ref 36.0–46.0)
Hemoglobin: 12.9 g/dL (ref 12.0–15.0)
Lymphocytes Relative: 26.5 % (ref 12.0–46.0)
Lymphs Abs: 2.2 10*3/uL (ref 0.7–4.0)
MCHC: 32.4 g/dL (ref 30.0–36.0)
MCV: 91.3 fl (ref 78.0–100.0)
Monocytes Absolute: 0.8 10*3/uL (ref 0.1–1.0)
Monocytes Relative: 9.7 % (ref 3.0–12.0)
Neutro Abs: 5 10*3/uL (ref 1.4–7.7)
Neutrophils Relative %: 61.4 % (ref 43.0–77.0)
Platelets: 414 10*3/uL — ABNORMAL HIGH (ref 150.0–400.0)
RBC: 4.37 Mil/uL (ref 3.87–5.11)
RDW: 13.9 % (ref 11.5–15.5)
WBC: 8.2 10*3/uL (ref 4.0–10.5)

## 2021-07-02 LAB — HEMOGLOBIN A1C: Hgb A1c MFr Bld: 5.5 % (ref 4.6–6.5)

## 2021-07-02 LAB — VITAMIN B12: Vitamin B-12: 949 pg/mL — ABNORMAL HIGH (ref 211–911)

## 2021-07-02 LAB — VITAMIN D 25 HYDROXY (VIT D DEFICIENCY, FRACTURES): VITD: 25.11 ng/mL — ABNORMAL LOW (ref 30.00–100.00)

## 2021-07-02 LAB — T4, FREE: Free T4: 0.65 ng/dL (ref 0.60–1.60)

## 2021-07-02 LAB — TSH: TSH: 15.43 u[IU]/mL — ABNORMAL HIGH (ref 0.35–5.50)

## 2021-07-03 ENCOUNTER — Other Ambulatory Visit: Payer: Self-pay | Admitting: Family Medicine

## 2021-07-03 ENCOUNTER — Other Ambulatory Visit: Payer: Self-pay | Admitting: Internal Medicine

## 2021-07-03 DIAGNOSIS — I10 Essential (primary) hypertension: Secondary | ICD-10-CM

## 2021-07-03 DIAGNOSIS — E039 Hypothyroidism, unspecified: Secondary | ICD-10-CM

## 2021-07-03 DIAGNOSIS — E876 Hypokalemia: Secondary | ICD-10-CM

## 2021-07-03 DIAGNOSIS — E559 Vitamin D deficiency, unspecified: Secondary | ICD-10-CM

## 2021-07-03 MED ORDER — POTASSIUM CHLORIDE CRYS ER 20 MEQ PO TBCR: 20.0000 meq | EXTENDED_RELEASE_TABLET | Freq: Every day | ORAL | 0 refills | Status: DC

## 2021-07-03 MED ORDER — VITAMIN D (ERGOCALCIFEROL) 1.25 MG (50000 UNIT) PO CAPS: 50000.0000 [IU] | ORAL_CAPSULE | ORAL | 0 refills | Status: DC

## 2021-07-03 NOTE — Addendum Note (Signed)
Addended by: Grier Mitts R on: 07/03/2021 01:11 PM   Modules accepted: Orders

## 2021-07-06 ENCOUNTER — Other Ambulatory Visit: Payer: Self-pay | Admitting: Family Medicine

## 2021-07-06 DIAGNOSIS — I10 Essential (primary) hypertension: Secondary | ICD-10-CM

## 2021-07-08 ENCOUNTER — Encounter: Payer: Self-pay | Admitting: Family Medicine

## 2021-07-08 ENCOUNTER — Other Ambulatory Visit: Payer: Self-pay | Admitting: Family Medicine

## 2021-07-08 DIAGNOSIS — I10 Essential (primary) hypertension: Secondary | ICD-10-CM

## 2021-07-15 ENCOUNTER — Other Ambulatory Visit: Payer: Self-pay | Admitting: Family Medicine

## 2021-07-15 ENCOUNTER — Other Ambulatory Visit: Payer: Self-pay | Admitting: Internal Medicine

## 2021-07-15 DIAGNOSIS — I10 Essential (primary) hypertension: Secondary | ICD-10-CM

## 2021-08-27 ENCOUNTER — Other Ambulatory Visit: Payer: Self-pay

## 2021-08-27 MED ORDER — LEVOTHYROXINE SODIUM 125 MCG PO TABS: 125.0000 ug | ORAL_TABLET | Freq: Every day | ORAL | 0 refills | Status: DC

## 2021-09-09 ENCOUNTER — Encounter: Payer: BC Managed Care – PPO | Admitting: Family Medicine

## 2021-09-25 ENCOUNTER — Other Ambulatory Visit: Payer: Self-pay | Admitting: Family Medicine

## 2021-09-25 DIAGNOSIS — Z1231 Encounter for screening mammogram for malignant neoplasm of breast: Secondary | ICD-10-CM

## 2021-09-27 NOTE — Progress Notes (Signed)
Name: Laurie Peters  MRN/ DOB: [id number], 07/28/1961    Age/ Sex: 61 y.o., female     PCP: Billie Ruddy, MD   Reason for Endocrinology Evaluation: 07/22/2018     Initial Endocrinology Clinic Visit: Hypothyroidism     PATIENT IDENTIFIER: Laurie Peters is a 61 y.o., female with a past medical history of menorrhagia and anemia secondary to fibroids, HTN, and Asthma  . She has followed with [facility] since 07/22/2018 for consultative assistance with management of her hypothyroidism    HISTORICAL SUMMARY: The patient was first diagnosed with abnormal thyroid function tests in 2017, she was c/o fatigue at the time. This was not investigated again until 05/2018.   She was started on LT-4 replacement in 06/2018  Mother with hypothyroidism  SUBJECTIVE:   Today (09/30/2021):  Laurie Peters is here for a follow up on hypothyroidism. She has been compliant with LT-4 replacement. She takes appropriately on an empty stomach, at least 30 minutes before breakfast.   She has been noted with weight gain over the past year  Denies constipation diarrhea  nor palpitations  Doesn't always sleep well at night due to aches  No local neck symptoms    She has a hx of vitamin D deficiency , she was on prescription ergocalciferol that she completed 2 weeks ago currently on OTC vitamin D3, she is not sure of the actual dose   HISTORY:  Past Medical History:  Past Medical History:  Diagnosis Date   Asthma    Heart murmur    History of blood transfusion    after birth per patient   Hypertension    SVD (spontaneous vaginal delivery)    x 4   Termination of pregnancy (fetus)    x 2   Uterine fibroids affecting pregnancy, antepartum    Past Surgical History:  Past Surgical History:  Procedure Laterality Date   CHOLECYSTECTOMY N/A 06/15/2016   Procedure: LAPAROSCOPIC CHOLECYSTECTOMY;  Surgeon: Coralie Keens, MD;  Location: [facility];  Service: General;  Laterality: N/A;    DILITATION & CURRETTAGE/HYSTROSCOPY WITH HYDROTHERMAL ABLATION N/A 07/08/2018   Procedure: DILATATION & CURETTAGE/HYSTEROSCOPY WITH HYDROTHERMAL ABLATION;  Surgeon: Everett Graff, MD;  Location: [facility];  Service: Gynecology;  Laterality: N/A;   WISDOM TOOTH EXTRACTION     only 2 removed   Social History:  reports that she has never smoked. She has never used smokeless tobacco. She reports current alcohol use. She reports that she does not use drugs. Family History:  Family History  Problem Relation Age of Onset   Hypertension Mother    Hypertension Father    Heart disease Father    Stroke Father    Asthma Sister    Asthma Son    Hyperlipidemia Maternal Grandmother    Heart disease Maternal Grandmother    Stroke Maternal Grandmother    Hyperlipidemia Paternal Grandmother    Heart disease Paternal Grandmother    Cancer Paternal Grandmother    Stroke Paternal Grandfather    Cancer Sister        uterus   Heart disease Brother    Heart disease Brother    Diabetes Brother    Heart disease Brother      HOME MEDICATIONS: Allergies as of 09/30/2021       Reactions   Theraflu Cold & [phenylephrine-pheniramine-dm] Hives        Medication List        Accurate as of September 30, 2021  7:46 AM.  If you have any questions, ask your nurse or doctor.          STOP taking these medications    azithromycin 250 MG tablet Commonly known as: ZITHROMAX Stopped by: Dorita Sciara, MD   benzonatate 100 MG capsule Commonly known as: TESSALON Stopped by: Dorita Sciara, MD       TAKE these medications    albuterol (2.5 MG/3ML) 0.083% nebulizer solution Commonly known as: PROVENTIL Take 3 mLs (2.5 mg total) by nebulization every 6 (six) hours as needed for wheezing or shortness of breath.   albuterol 108 (90 Base) MCG/ACT inhaler Commonly known as: VENTOLIN HFA Inhale 2 puffs into the lungs every 4 (four) hours as needed for wheezing or shortness of breath.    chlorthalidone 25 MG tablet Commonly known as: HYGROTON TAKE 1 TABLET BY MOUTH ONCE DAILY . APPOINTMENT REQUIRED FOR FUTURE REFILLS   fluticasone 50 MCG/ACT nasal spray Commonly known as: FLONASE Place 2 sprays into both nostrils daily.   levothyroxine 125 MCG tablet Commonly known as: SYNTHROID Take 1 tablet (125 mcg total) by mouth daily.   potassium chloride SA 20 MEQ tablet Commonly known as: KLOR-CON M Take 1 tablet (20 mEq total) by mouth daily for 4 days.   vitamin B-12 50 MCG tablet Commonly known as: CYANOCOBALAMIN Take 50 mcg by mouth daily.   vitamin C 1000 MG tablet Take 1,000 mg by mouth daily.   Vitamin D (Ergocalciferol) 1.25 MG (50000 UNIT) Caps capsule Commonly known as: DRISDOL Take 1 capsule (50,000 Units total) by mouth every 7 (seven) days.   Vitamin D3 50 MCG (2000 UT) Tabs Take 1 tablet by mouth daily.          OBJECTIVE:   PHYSICAL EXAM: VS: BP 140/88 (BP Location: Left Wrist, Patient Position: Sitting, Cuff Size: Normal)    Pulse 86    Ht 5\' 6"  (1.676 m)    Wt (!) 345 lb 6.4 oz (156.7 kg)    LMP  (LMP Unknown) Comment: Ablation   SpO2 96%    BMI 55.75 kg/m    EXAM: General: Pt appears well and is in NAD  Neck: General: Supple without adenopathy. Thyroid: Thyroid size normal.  No goiter or nodules appreciated. No thyroid bruit.  Lungs: Clear with good BS bilat with no rales, rhonchi, or wheezes  Heart: Auscultation: RRR.  Extremities:  BL LE: Trace pretibial edema normal ROM and strength.  Mental Status: Judgment, insight: Intact Orientation: Oriented to time, place, and person Mood and affect: No depression, anxiety, or agitation     DATA REVIEWED:  Latest Reference Range & Units 09/30/21 08:03  VITD 30.00 - 100.00 ng/mL 30.57    Latest Reference Range & Units 09/30/21 08:03  TSH 0.35 - 5.50 uIU/mL 1.07    ASSESSMENT / PLAN / RECOMMENDATIONS:   1. Hypothyroidism:   -Patient is clinically euthyroid - No local neck  symptoms  -TSH normal, no changes   Medications  Continue  levothyroxine 125 MCG daily    Follow-up in 1 year   Addendum: Discussed results with pt on 05/26/19 @ 1315 Signed electronically by: Mack Guise, MD  [facility]  [facility] [address] [place], [place] [address] Phone: [phone] FAX: [phone]      CC: Billie Ruddy, [place] [address] Phone: [phone]  Fax: [phone]   Return to Endocrinology clinic as below: Future Appointments  Date Time Provider [place]  10/03/2021  8:00 AM Billie Ruddy, MD [id number]  10/11/2021  7:10 AM [facility] 3 [facility]

## 2021-09-30 ENCOUNTER — Encounter: Payer: Self-pay | Admitting: Internal Medicine

## 2021-09-30 ENCOUNTER — Ambulatory Visit (INDEPENDENT_AMBULATORY_CARE_PROVIDER_SITE_OTHER): Payer: 59 | Admitting: Internal Medicine

## 2021-09-30 ENCOUNTER — Other Ambulatory Visit: Payer: Self-pay

## 2021-09-30 VITALS — BP 140/88 | HR 86 | Ht 66.0 in | Wt 345.4 lb

## 2021-09-30 DIAGNOSIS — E039 Hypothyroidism, unspecified: Secondary | ICD-10-CM | POA: Diagnosis not present

## 2021-09-30 LAB — VITAMIN D 25 HYDROXY (VIT D DEFICIENCY, FRACTURES): VITD: 30.57 ng/mL (ref 30.00–100.00)

## 2021-09-30 LAB — TSH: TSH: 1.07 u[IU]/mL (ref 0.35–5.50)

## 2021-09-30 MED ORDER — LEVOTHYROXINE SODIUM 125 MCG PO TABS: 125.0000 ug | ORAL_TABLET | Freq: Every day | ORAL | 3 refills | Status: DC

## 2021-09-30 NOTE — Patient Instructions (Signed)

## 2021-10-03 ENCOUNTER — Ambulatory Visit (INDEPENDENT_AMBULATORY_CARE_PROVIDER_SITE_OTHER): Payer: 59 | Admitting: Family Medicine

## 2021-10-03 ENCOUNTER — Other Ambulatory Visit: Payer: Self-pay | Admitting: Family Medicine

## 2021-10-03 VITALS — BP 138/86 | HR 98 | Temp 98.6°F | Wt 347.2 lb

## 2021-10-03 DIAGNOSIS — R600 Localized edema: Secondary | ICD-10-CM | POA: Diagnosis not present

## 2021-10-03 DIAGNOSIS — Z23 Encounter for immunization: Secondary | ICD-10-CM | POA: Diagnosis not present

## 2021-10-03 DIAGNOSIS — Z0000 Encounter for general adult medical examination without abnormal findings: Secondary | ICD-10-CM

## 2021-10-03 DIAGNOSIS — E559 Vitamin D deficiency, unspecified: Secondary | ICD-10-CM | POA: Diagnosis not present

## 2021-10-03 DIAGNOSIS — I10 Essential (primary) hypertension: Secondary | ICD-10-CM

## 2021-10-03 DIAGNOSIS — Z13220 Encounter for screening for lipoid disorders: Secondary | ICD-10-CM | POA: Diagnosis not present

## 2021-10-03 DIAGNOSIS — E039 Hypothyroidism, unspecified: Secondary | ICD-10-CM | POA: Diagnosis not present

## 2021-10-03 LAB — CBC WITH DIFFERENTIAL/PLATELET
Basophils Absolute: 0.1 10*3/uL (ref 0.0–0.1)
Basophils Relative: 0.8 % (ref 0.0–3.0)
Eosinophils Absolute: 0.1 10*3/uL (ref 0.0–0.7)
Eosinophils Relative: 1.4 % (ref 0.0–5.0)
HCT: 41.2 % (ref 36.0–46.0)
Hemoglobin: 13.3 g/dL (ref 12.0–15.0)
Lymphocytes Relative: 30.4 % (ref 12.0–46.0)
Lymphs Abs: 2.2 10*3/uL (ref 0.7–4.0)
MCHC: 32.2 g/dL (ref 30.0–36.0)
MCV: 90.9 fl (ref 78.0–100.0)
Monocytes Absolute: 0.7 10*3/uL (ref 0.1–1.0)
Monocytes Relative: 9.9 % (ref 3.0–12.0)
Neutro Abs: 4.1 10*3/uL (ref 1.4–7.7)
Neutrophils Relative %: 57.5 % (ref 43.0–77.0)
Platelets: 446 10*3/uL — ABNORMAL HIGH (ref 150.0–400.0)
RBC: 4.54 Mil/uL (ref 3.87–5.11)
RDW: 14.1 % (ref 11.5–15.5)
WBC: 7.2 10*3/uL (ref 4.0–10.5)

## 2021-10-03 LAB — COMPREHENSIVE METABOLIC PANEL
ALT: 18 U/L (ref 0–35)
AST: 16 U/L (ref 0–37)
Albumin: 4 g/dL (ref 3.5–5.2)
Alkaline Phosphatase: 84 U/L (ref 39–117)
BUN: 12 mg/dL (ref 6–23)
CO2: 31 mEq/L (ref 19–32)
Calcium: 10 mg/dL (ref 8.4–10.5)
Chloride: 101 mEq/L (ref 96–112)
Creatinine, Ser: 0.77 mg/dL (ref 0.40–1.20)
GFR: 84.76 mL/min (ref >60.00)
Glucose, Bld: 93 mg/dL (ref 70–99)
Potassium: 3.7 mEq/L (ref 3.5–5.1)
Sodium: 139 mEq/L (ref 135–145)
Total Bilirubin: 0.4 mg/dL (ref 0.2–1.2)
Total Protein: 7.7 g/dL (ref 6.0–8.3)

## 2021-10-03 LAB — LIPID PANEL
Cholesterol: 201 mg/dL — ABNORMAL HIGH (ref 0–200)
HDL: 62.6 mg/dL (ref >39.00)
LDL Cholesterol: 108 mg/dL — ABNORMAL HIGH (ref 0–99)
NonHDL: 138.73
Total CHOL/HDL Ratio: 3
Triglycerides: 155 mg/dL — ABNORMAL HIGH (ref 0.0–149.0)
VLDL: 31 mg/dL (ref 0.0–40.0)

## 2021-10-03 LAB — BRAIN NATRIURETIC PEPTIDE: Pro B Natriuretic peptide (BNP): 8 pg/mL (ref 0.0–100.0)

## 2021-10-03 LAB — VITAMIN D 25 HYDROXY (VIT D DEFICIENCY, FRACTURES): VITD: 35.39 ng/mL (ref 30.00–100.00)

## 2021-10-03 LAB — HEMOGLOBIN A1C: Hgb A1c MFr Bld: 5.5 % (ref 4.6–6.5)

## 2021-10-03 MED ORDER — CHLORTHALIDONE 25 MG PO TABS: ORAL_TABLET | ORAL | 3 refills | Status: DC

## 2021-10-03 NOTE — Progress Notes (Signed)
Subjective:     Laurie Peters is a 61 y.o. female and is here for a comprehensive physical exam.  Patient endorses some increased stress which may be contributing to BP elevation.  States started a new job for [employer] and has to [employer].  Patient notes bilateral lower extremity edema throughout the day.  States seems worse when not moving more.  Typically sits at her desk for work without taking breaks.  Patient does not note any increased sodium intake.  Patient notes an episode of blurred vision and dizziness one morning.  Mammogram schedule in a few weeks.  Patient has to call to reschedule colonoscopy.  Pt typically dose not get influenza vaccines as she is concerned it will make her sick.  Social History   Socioeconomic History   Marital status: Single    Spouse name: Not on file   Number of children: 4   Years of education: Not on file   Highest education level: Not on file  Occupational History   Not on file  Tobacco Use   Smoking status: Never   Smokeless tobacco: Never  Vaping Use   Vaping Use: Never used  Substance and Sexual Activity   Alcohol use: Yes    Comment: wine twice a month   Drug use: No   Sexual activity: Not Currently    Birth control/protection: None  Other Topics Concern   Not on file  Social History Narrative   Not on file   Social Determinants of Health   Financial Resource Strain: Not on file  Food Insecurity: Not on file  Transportation Needs: Not on file  Physical Activity: Not on file  Stress: Not on file  Social Connections: Not on file  Intimate Partner Violence: Not on file   Health Maintenance  Topic Date Due   Pneumococcal Vaccine 22-52 Years old (1 - PCV) Never done   HIV Screening  Never done   Hepatitis C Screening  Never done   TETANUS/TDAP  Never done   Zoster Vaccines- Shingrix (1 of 2) Never done   PAP SMEAR-Modifier  Never done   COLONOSCOPY (Pts 45-44yrs Insurance coverage will need to be confirmed)   Never done   MAMMOGRAM  12/16/2017   COVID-19 Vaccine (3 - Pfizer risk series) 02/08/2020   INFLUENZA VACCINE  Never done   HPV VACCINES  Aged Out    The following portions of the patient's history were reviewed and updated as appropriate: allergies, current medications, past family history, past medical history, past social history, past surgical history, and problem list.  Review of Systems Pertinent items noted in HPI and remainder of comprehensive ROS otherwise negative.   Objective:    BP 138/86 (BP Location: Right Wrist, Patient Position: Sitting, Cuff Size: Large)    Pulse 98    Temp 98.6 F (37 C) (Oral)    Wt (!) 347 lb 3.2 oz (157.5 kg)    LMP  (LMP Unknown) Comment: Ablation   SpO2 97%    BMI 56.04 kg/m  General appearance: alert, cooperative, and no distress Head: Normocephalic, without obvious abnormality, atraumatic Eyes: conjunctivae/corneas clear. PERRL, EOM's intact. Fundi benign. Ears: normal TM's and external ear canals both ears Nose: Nares normal. Septum midline. Mucosa normal. No drainage or sinus tenderness. Throat: lips, mucosa, and tongue normal; teeth and gums normal Neck: no adenopathy, no carotid bruit, no JVD, supple, symmetrical, trachea midline, and thyroid not enlarged, symmetric, no tenderness/mass/nodules Lungs: clear to auscultation bilaterally Heart: regular  rate and rhythm, S1, S2 normal, no murmur, click, rub or gallop Abdomen: soft, non-tender; bowel sounds normal; no masses,  no organomegaly Extremities: extremities normal, atraumatic, no cyanosis or edema Pulses: 2+ and symmetric Skin: Skin color, texture, turgor normal. No rashes or lesions Lymph nodes: Cervical, supraclavicular, and axillary nodes normal. Neurologic: Alert and oriented X 3, normal strength and tone. Normal symmetric reflexes. Normal coordination and gait    Assessment:    Healthy female exam.      Plan:    Anticipatory guidance given including wearing seatbelts, smoke  detectors in the home, increasing physical activity, increasing p.o. intake of water and vegetables. -labs -will complete form for pt's insurance when lab results available. -discussed immunizations.   -Mammogram scheduled -Colonoscopy to be scheduled -Given handout -Next CPE in 1 year See After Visit Summary for Counseling Recommendations   Essential hypertension -Elevated -Lifestyle modifications -Continue chlorthalidone 25 mg daily -Patient advised to increase physical activity and monitor BP at home.  For continued BP elevation start additional medication.  - Plan: CMP, chlorthalidone 25 mg  Morbid obesity ([place]) -Discussed increasing physical activity - Plan: CBC with Differential/Platelet, Hemoglobin A1c, Lipid panel  Bilateral lower extremity edema  -Likely dependent edema 2/2 prolonged sitting throughout the day -Supportive measures including elevating LEs when sitting, TED hose or compression socks, and decreasing sodium intake - Plan: Brain Natriuretic Peptide, CMP  Need for influenza vaccination  - Plan: Flu Vaccine QUAD 6+ mos PF IM (Fluarix Quad PF)  Acquired hypothyroidism -Continue follow-up with endocrinology -Continue levothyroxine 125 mcg daily  Vitamin D deficiency - Plan: Vitamin D, 25-hydroxy  Screening for cholesterol level - Plan: Lipid panel  Follow-up in 3-4 months, sooner if needed  Grier Mitts, MD

## 2021-10-03 NOTE — Patient Instructions (Signed)
You can call [facility] when you are ready to schedule your colonoscopy.  Referral was placed and is in the system.  [facility] Address: [address], [place], [place] [address] Phone: [phone]

## 2021-10-04 MED ORDER — VITAMIN D3 50 MCG (2000 UT) PO TABS: 1.0000 | ORAL_TABLET | Freq: Every day | ORAL | 3 refills | Status: AC

## 2021-10-08 ENCOUNTER — Other Ambulatory Visit: Payer: Self-pay | Admitting: Family Medicine

## 2021-10-08 DIAGNOSIS — I10 Essential (primary) hypertension: Secondary | ICD-10-CM

## 2021-10-11 ENCOUNTER — Ambulatory Visit
Admission: RE | Admit: 2021-10-11 | Discharge: 2021-10-11 | Disposition: A | Discharge status: Home / Self Care | Payer: 59 | Source: Ambulatory Visit

## 2021-10-11 DIAGNOSIS — Z1231 Encounter for screening mammogram for malignant neoplasm of breast: Secondary | ICD-10-CM

## 2021-10-18 ENCOUNTER — Other Ambulatory Visit: Payer: Self-pay | Admitting: Family Medicine

## 2021-10-18 DIAGNOSIS — R928 Other abnormal and inconclusive findings on diagnostic imaging of breast: Secondary | ICD-10-CM

## 2021-11-06 ENCOUNTER — Other Ambulatory Visit: Payer: 59

## 2021-11-18 ENCOUNTER — Encounter: Payer: Self-pay | Admitting: Family Medicine

## 2021-11-19 ENCOUNTER — Other Ambulatory Visit: Payer: 59

## 2021-11-21 ENCOUNTER — Ambulatory Visit (INDEPENDENT_AMBULATORY_CARE_PROVIDER_SITE_OTHER): Payer: 59 | Admitting: Family Medicine

## 2021-11-21 ENCOUNTER — Encounter: Payer: Self-pay | Admitting: Family Medicine

## 2021-11-21 VITALS — BP 127/74 | HR 72 | Temp 98.7°F | Wt 349.2 lb

## 2021-11-21 DIAGNOSIS — R591 Generalized enlarged lymph nodes: Secondary | ICD-10-CM

## 2021-11-21 DIAGNOSIS — H66002 Acute suppurative otitis media without spontaneous rupture of ear drum, left ear: Secondary | ICD-10-CM

## 2021-11-21 DIAGNOSIS — J309 Allergic rhinitis, unspecified: Secondary | ICD-10-CM

## 2021-11-21 MED ORDER — CETIRIZINE HCL 10 MG PO TABS
10.0000 mg | ORAL_TABLET | Freq: Every day | ORAL | 1 refills | Status: AC
Start: 2021-11-21 — End: ?

## 2021-11-21 MED ORDER — AMOXICILLIN-POT CLAVULANATE 500-125 MG PO TABS
1.0000 | ORAL_TABLET | Freq: Two times a day (BID) | ORAL | 0 refills | Status: AC
Start: 2021-11-21 — End: 2021-11-28

## 2021-11-21 NOTE — Telephone Encounter (Signed)
Pt has appointment today 11/21/21. ?

## 2021-11-21 NOTE — Progress Notes (Signed)
Subjective:  ? ? Patient ID: Laurie Peters, female    DOB: 05/15/1961, 61 y.o.   MRN: [id number] ? ?Chief Complaint  ?Patient presents with  ? Adenopathy  ?  Right one is still swollen, but not as bad as when noticed on Monday night.   ? ? ?HPI ?Patient was seen today for acute concern.  Pt notes lymph node inferior to right ear was swollen on Monday tender.  The area was painful with talking.  Lymph node has since gone down in size, and is mildly tender.  Patient endorses cough/cold symptoms including nasal drainage and cough the week prior.  Nasal drainage was worse at night.  Endorses mild left ear discomfort.  Patient did not try anything for symptoms.  Patient endorses recent travel to [place] and [place].   ? ?Past Medical History:  ?Diagnosis Date  ? Asthma   ? Heart murmur   ? History of blood transfusion   ? after birth per patient  ? Hypertension   ? SVD (spontaneous vaginal delivery)   ? x 4  ? Termination of pregnancy (fetus)   ? x 2  ? Uterine fibroids affecting pregnancy, antepartum   ? ? ?Allergies  ?Allergen Reactions  ? Theraflu Cold & [Phenylephrine-Pheniramine-Dm] Hives  ? ? ?ROS ?General: Denies fever, chills, night sweats, changes in weight, changes in appetite ?HEENT: Denies headaches, ear pain, changes in vision, rhinorrhea, sore throat  + left ear discomfort, rhinorrhea, lymphadenopathy inferior to right ear ?CV: Denies CP, palpitations, SOB, orthopnea ?Pulm: Denies SOB, wheezing  + cough ?GI: Denies abdominal pain, nausea, vomiting, diarrhea, constipation ?GU: Denies dysuria, hematuria, frequency, vaginal discharge ?Msk: Denies muscle cramps, joint pains ?Neuro: Denies weakness, numbness, tingling ?Skin: Denies rashes, bruising ?Psych: Denies depression, anxiety, hallucinations ? ?   ?Objective:  ?  ?Blood pressure 127/74, pulse 72, temperature 98.7 ?F (37.1 ?C), temperature source Oral, weight (!) 349 lb 3.2 oz (158.4 kg). ? ?Gen. Pleasant, well-nourished, in no distress, normal affect    ?HEENT: [place]/AT, face symmetric, conjunctiva clear, no scleral icterus, PERRLA, EOMI, nares patent without drainage, pharynx with postnasal drainage, no erythema or exudate.  Right TM dull in color, flat.  Left TM full with erythema and suppurative fluid around edges.  Mild right-sided cervical lymphadenopathy. ?Lungs: no accessory muscle use, CTAB, no wheezes or rales ?Cardiovascular: RRR, no m/r/g, no peripheral edema ?Musculoskeletal: No deformities, no cyanosis or clubbing, normal tone ?Neuro:  A&Ox3, CN II-XII intact, normal gait ?Skin:  Warm, no lesions/ rash ? ? ?Wt Readings from Last 3 Encounters:  ?11/21/21 (!) 349 lb 3.2 oz (158.4 kg)  ?10/03/21 (!) 347 lb 3.2 oz (157.5 kg)  ?09/30/21 (!) 345 lb 6.4 oz (156.7 kg)  ? ? ?Lab Results  ?Component Value Date  ? WBC 7.2 10/03/2021  ? HGB 13.3 10/03/2021  ? HCT 41.2 10/03/2021  ? PLT 446.0 (H) 10/03/2021  ? GLUCOSE 93 10/03/2021  ? CHOL 201 (H) 10/03/2021  ? TRIG 155.0 (H) 10/03/2021  ? HDL 62.60 10/03/2021  ? LDLCALC 108 (H) 10/03/2021  ? ALT 18 10/03/2021  ? AST 16 10/03/2021  ? NA 139 10/03/2021  ? K 3.7 10/03/2021  ? CL 101 10/03/2021  ? CREATININE 0.77 10/03/2021  ? BUN 12 10/03/2021  ? CO2 31 10/03/2021  ? TSH 1.07 09/30/2021  ? HGBA1C 5.5 10/03/2021  ? ? ?Assessment/Plan: ?Today is patient's birthday! ? ?Acute suppurative otitis media of left ear without spontaneous rupture of tympanic membrane, recurrence not specified  ?-Acute  AOM likely 2/2 eustachian tube dysfunction from recent travel ?-Start ABX ?-Tylenol as needed for pain/discomfort ?- Plan: amoxicillin-clavulanate (AUGMENTIN) 500-125 MG tablet ? ?Lymphadenopathy ?-Improving ?-2/2 recent acute illness/allergic rhinitis ?-Monitor for resolution ? ?Allergic rhinitis, unspecified seasonality, unspecified trigger ?-Likely exacerbated by recent change in weather and travel ?- Plan: cetirizine (ZYRTEC) 10 MG tablet ? ?F/u as needed ? ?Grier Mitts, MD ?

## 2021-11-21 NOTE — Patient Instructions (Addendum)
Happy birthday!!!!  Take some time to enjoy your day. ?

## 2021-12-11 ENCOUNTER — Encounter: Payer: Self-pay | Admitting: Gastroenterology

## 2021-12-11 ENCOUNTER — Telehealth: Payer: Self-pay | Admitting: *Deleted

## 2021-12-11 NOTE — Telephone Encounter (Signed)
Dr.Cirigliano, ? ?This patient is scheduled for a direct screening colonoscopy with you. Her last BMI on 11/21/21 is 56, weight 349lbs. Hx of asthma. Would you like the patient to have an OV or direct hospital okay? ?Please advise. Thank you, Damarion Mendizabal pv ?

## 2021-12-11 NOTE — Telephone Encounter (Signed)
OV first please. Thanks.  ?

## 2021-12-11 NOTE — Telephone Encounter (Signed)
Called patient, no answer. Left a message for the patient to call back. She needs to make an OV. PV and colon cancelled at this time.  ?

## 2021-12-12 NOTE — Telephone Encounter (Signed)
Notified patient that her PV and colon was cancelled and she needed OV per Dr.Cirigliano's request due to BMI over 50 per [facility] policy. Made office visit for 4/14 with PA. Patient request to have procedure week of 4-21 when her daughter is coming to visit so she can be her driver. Patient request that I find out if we have appointments available for that week for hospital colonoscopy. I spoke with Linda,RN-per [facility] no available hospital colonoscopy for that week in April, [facility] Basta states we are booking into June. Pt notified and pt request that her OV be cancelled she states she will go somewhere else. OV cancelled.  ?

## 2021-12-20 ENCOUNTER — Ambulatory Visit
Admission: RE | Admit: 2021-12-20 | Discharge: 2021-12-20 | Disposition: A | Discharge status: Home / Self Care | Payer: 59 | Source: Ambulatory Visit | Attending: Family Medicine | Admitting: Family Medicine

## 2021-12-20 DIAGNOSIS — R928 Other abnormal and inconclusive findings on diagnostic imaging of breast: Secondary | ICD-10-CM

## 2022-01-03 ENCOUNTER — Ambulatory Visit: Payer: 59 | Admitting: Gastroenterology

## 2022-01-10 ENCOUNTER — Encounter: Payer: 59 | Admitting: Gastroenterology

## 2022-05-14 ENCOUNTER — Telehealth: Payer: Self-pay | Admitting: Gastroenterology

## 2022-05-14 ENCOUNTER — Encounter: Payer: Self-pay | Admitting: Family Medicine

## 2022-05-14 NOTE — Telephone Encounter (Signed)
We received a referral for this patient to have a colonoscopy, she states she lives alone and she is able to obtain assistance for a care partner through her employer she is requesting for a letter stating the need of a care partner in order for her to proceed with the procedure. Thank you

## 2022-05-16 NOTE — Telephone Encounter (Signed)
Letter sent to pt's mychart per pt request.

## 2022-07-25 ENCOUNTER — Other Ambulatory Visit: Payer: Self-pay | Admitting: Internal Medicine

## 2022-07-25 ENCOUNTER — Other Ambulatory Visit: Payer: Self-pay | Admitting: Family Medicine

## 2022-07-25 DIAGNOSIS — I10 Essential (primary) hypertension: Secondary | ICD-10-CM

## 2022-07-25 NOTE — Telephone Encounter (Signed)
Does pt know the city or location of the GI office she wants to go to in [place]?

## 2022-07-29 ENCOUNTER — Other Ambulatory Visit: Payer: Self-pay | Admitting: Internal Medicine

## 2022-07-30 MED ORDER — LEVOTHYROXINE SODIUM 125 MCG PO TABS: 125.0000 ug | ORAL_TABLET | Freq: Every day | ORAL | 0 refills | Status: DC

## 2022-09-24 ENCOUNTER — Encounter: Payer: 59 | Admitting: Family Medicine

## 2022-10-01 ENCOUNTER — Other Ambulatory Visit: Payer: Self-pay | Admitting: Internal Medicine

## 2022-10-02 NOTE — Progress Notes (Unsigned)
Name: Laurie Peters  MRN/ DOB: [id number], 1961-03-18    Age/ Sex: 62 y.o., female     PCP: Billie Ruddy, MD   Reason for Endocrinology Evaluation: 07/22/2018     Initial Endocrinology Clinic Visit: Hypothyroidism     PATIENT IDENTIFIER: Ms. Laurie Peters is a 62 y.o., female with a past medical history of menorrhagia and anemia secondary to fibroids, HTN, and Asthma  . She has followed with [facility] since 07/22/2018 for consultative assistance with management of her hypothyroidism    HISTORICAL SUMMARY: The patient was first diagnosed with abnormal thyroid function tests in 2017, she was c/o fatigue at the time. This was not investigated again until 05/2018.   She was started on LT-4 replacement in 06/2018  Mother with hypothyroidism  SUBJECTIVE:   Today (10/03/2022):  Ms. Laurie Peters is here for a follow up on hypothyroidism.   She has been noted with weight loss over the past year , she attributes this to herbal tea that she has been making  Denies local neck swelling  Denies constipation or diarrhea  Denies palpitations  Has minimal hand tremors     HISTORY:  Past Medical History:  Past Medical History:  Diagnosis Date   Asthma    Heart murmur    History of blood transfusion    after birth per patient   Hypertension    SVD (spontaneous vaginal delivery)    x 4   Termination of pregnancy (fetus)    x 2   Uterine fibroids affecting pregnancy, antepartum    Past Surgical History:  Past Surgical History:  Procedure Laterality Date   CHOLECYSTECTOMY N/A 06/15/2016   Procedure: LAPAROSCOPIC CHOLECYSTECTOMY;  Surgeon: Coralie Keens, MD;  Location: [facility];  Service: General;  Laterality: N/A;   DILITATION & CURRETTAGE/HYSTROSCOPY WITH HYDROTHERMAL ABLATION N/A 07/08/2018   Procedure: DILATATION & CURETTAGE/HYSTEROSCOPY WITH HYDROTHERMAL ABLATION;  Surgeon: Everett Graff, MD;  Location: [facility];  Service: Gynecology;  Laterality: N/A;    WISDOM TOOTH EXTRACTION     only 2 removed   Social History:  reports that she has never smoked. She has never used smokeless tobacco. She reports current alcohol use. She reports that she does not use drugs. Family History:  Family History  Problem Relation Age of Onset   Hypertension Mother    Hypertension Father    Heart disease Father    Stroke Father    Asthma Sister    Asthma Son    Hyperlipidemia Maternal Grandmother    Heart disease Maternal Grandmother    Stroke Maternal Grandmother    Hyperlipidemia Paternal Grandmother    Heart disease Paternal Grandmother    Cancer Paternal Grandmother    Stroke Paternal Grandfather    Cancer Sister        uterus   Heart disease Brother    Heart disease Brother    Diabetes Brother    Heart disease Brother      HOME MEDICATIONS: Allergies as of 10/03/2022       Reactions   Theraflu Cold & [phenylephrine-pheniramine-dm] Hives        Medication List        Accurate as of October 03, 2022  8:05 AM. If you have any questions, ask your nurse or doctor.          albuterol (2.5 MG/3ML) 0.083% nebulizer solution Commonly known as: PROVENTIL Take 3 mLs (2.5 mg total) by nebulization every 6 (six) hours as needed for wheezing or  shortness of breath.   albuterol 108 (90 Base) MCG/ACT inhaler Commonly known as: VENTOLIN HFA Inhale 2 puffs into the lungs every 4 (four) hours as needed for wheezing or shortness of breath.   cetirizine 10 MG tablet Commonly known as: ZYRTEC Take 1 tablet (10 mg total) by mouth daily.   chlorthalidone 25 MG tablet Commonly known as: HYGROTON TAKE 1 TABLET BY MOUTH ONCE  DAILY   fluticasone 50 MCG/ACT nasal spray Commonly known as: FLONASE Place 2 sprays into both nostrils daily.   levothyroxine 125 MCG tablet Commonly known as: SYNTHROID TAKE 1 TABLET BY MOUTH DAILY   vitamin B-12 50 MCG tablet Commonly known as: CYANOCOBALAMIN Take 50 mcg by mouth daily.   vitamin C 1000 MG  tablet Take 1,000 mg by mouth daily.   Vitamin D3 50 MCG (2000 UT) Tabs Take 1 tablet by mouth daily.          OBJECTIVE:   PHYSICAL EXAM: VS: BP 124/86 (BP Location: Left Arm, Patient Position: Sitting, Cuff Size: Large)   Pulse (!) 101   Ht '5\' 6"'$  (1.676 m)   Wt (!) 334 lb 6.4 oz (151.7 kg)   LMP  (LMP Unknown) Comment: Ablation  SpO2 95%   BMI 53.97 kg/m    EXAM: General: Pt appears well and is in NAD  Neck: General: Supple without adenopathy. Thyroid: Thyroid size normal.  No goiter or nodules appreciated  Lungs: Clear with good BS bilat with no rales, rhonchi, or wheezes  Heart: Auscultation: RRR.  Extremities:  BL LE: Trace pretibial edema normal ROM and strength.  Mental Status: Judgment, insight: Intact Orientation: Oriented to time, place, and person Mood and affect: No depression, anxiety, or agitation     DATA REVIEWED: ****   ASSESSMENT / PLAN / RECOMMENDATIONS:   1. Hypothyroidism:   -Patient is clinically euthyroid - No local neck symptoms  -TSH normal, no changes   Medications  Continue  levothyroxine 125 MCG daily    Follow-up in 1 year   Addendum: Discussed results with pt on 05/26/19 @ 1315 Signed electronically by: Mack Guise, MD  [facility]  [facility] [address] [place], [place] [address] Phone: [phone] FAX: [phone]      CC: Billie Ruddy, [place] [address] Phone: [phone]  Fax: [phone]   Return to Endocrinology clinic as below: No future appointments.

## 2022-10-03 ENCOUNTER — Ambulatory Visit (INDEPENDENT_AMBULATORY_CARE_PROVIDER_SITE_OTHER): Payer: 59 | Admitting: Internal Medicine

## 2022-10-03 ENCOUNTER — Encounter: Payer: Self-pay | Admitting: Internal Medicine

## 2022-10-03 VITALS — BP 124/86 | HR 101 | Ht 66.0 in | Wt 334.4 lb

## 2022-10-03 DIAGNOSIS — E039 Hypothyroidism, unspecified: Secondary | ICD-10-CM

## 2022-10-03 LAB — TSH: TSH: 2.73 u[IU]/mL (ref 0.35–5.50)

## 2022-10-03 NOTE — Patient Instructions (Signed)

## 2022-10-06 ENCOUNTER — Ambulatory Visit: Payer: 59 | Admitting: Family Medicine

## 2022-10-06 MED ORDER — LEVOTHYROXINE SODIUM 125 MCG PO TABS: 125.0000 ug | ORAL_TABLET | Freq: Every day | ORAL | 3 refills | Status: DC

## 2022-11-21 ENCOUNTER — Encounter: Payer: 59 | Admitting: Family Medicine

## 2023-03-18 ENCOUNTER — Telehealth: Payer: Self-pay | Admitting: Internal Medicine

## 2023-03-18 DIAGNOSIS — E039 Hypothyroidism, unspecified: Secondary | ICD-10-CM

## 2023-03-18 MED ORDER — LEVOTHYROXINE SODIUM 125 MCG PO TABS: 125.0000 ug | ORAL_TABLET | Freq: Every day | ORAL | 3 refills | Status: DC

## 2023-03-18 NOTE — Telephone Encounter (Signed)
MEDICATION:  levothyroxine levothyroxine (SYNTHROID) 125 MCG tablet  PHARMACY:    [facility] - [place], [place] - [address] (Ph: [phone])    HAS THE PATIENT CONTACTED THEIR PHARMACY?  Yes  IS THIS A 90 DAY SUPPLY : Yes  IS PATIENT OUT OF MEDICATION: No  IF NOT; HOW MUCH IS LEFT: Patient not sure but states running very low  LAST APPOINTMENT DATE: @1 /08/2023  NEXT APPOINTMENT DATE:@Visit  date not found  DO WE HAVE YOUR PERMISSION TO LEAVE A DETAILED MESSAGE?: Yes  OTHER COMMENTS:    **Let patient know to contact pharmacy at the end of the day to make sure medication is ready. **  ** Please notify patient to allow 48-72 hours to process**  **Encourage patient to contact the pharmacy for refills or they can request refills through [facility]**

## 2023-03-18 NOTE — Telephone Encounter (Signed)
Levothyroxine 125 mcg has been sent to [facility]

## 2023-05-31 IMAGING — MG MM DIGITAL DIAGNOSTIC UNILAT*R* W/ TOMO W/ CAD
4 series · 4 of 12 positions shown · non-contrast
Comparison: Previous exam(s).

CLINICAL DATA: 61-year-old female recalled from screening mammogram
dated 10/11/2021 for a possible right breast mass.

EXAM:
DIGITAL DIAGNOSTIC UNILATERAL RIGHT MAMMOGRAM WITH TOMOSYNTHESIS AND
CAD; ULTRASOUND RIGHT BREAST LIMITED
TECHNIQUE: Right digital diagnostic mammography and breast tomosynthesis was
performed. The images were evaluated with computer-aided detection.;
Targeted ultrasound examination of the right breast was performed

[R CC synth-2D]
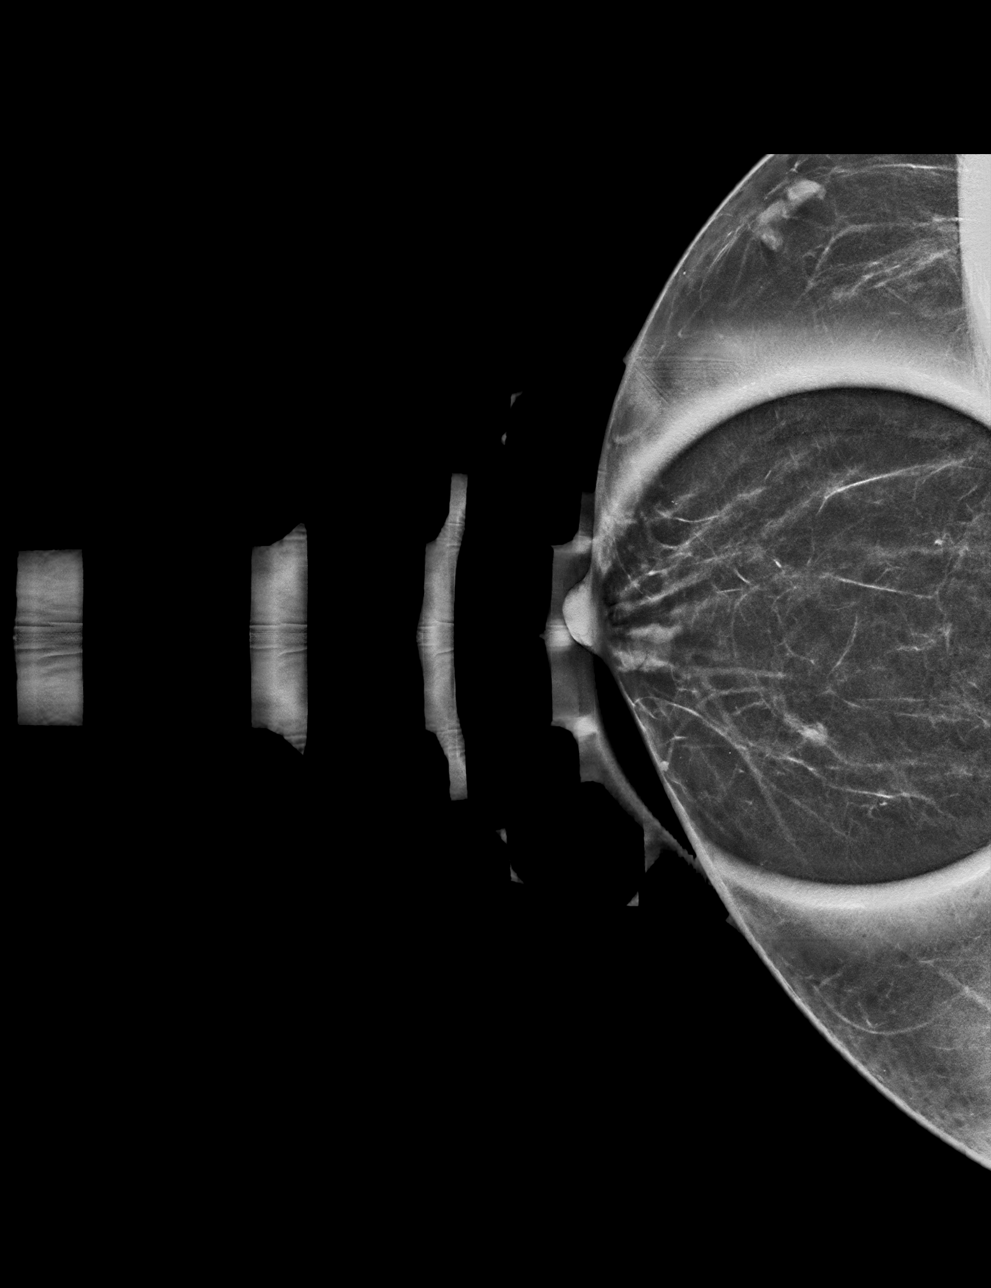

[R MLO synth-2D]
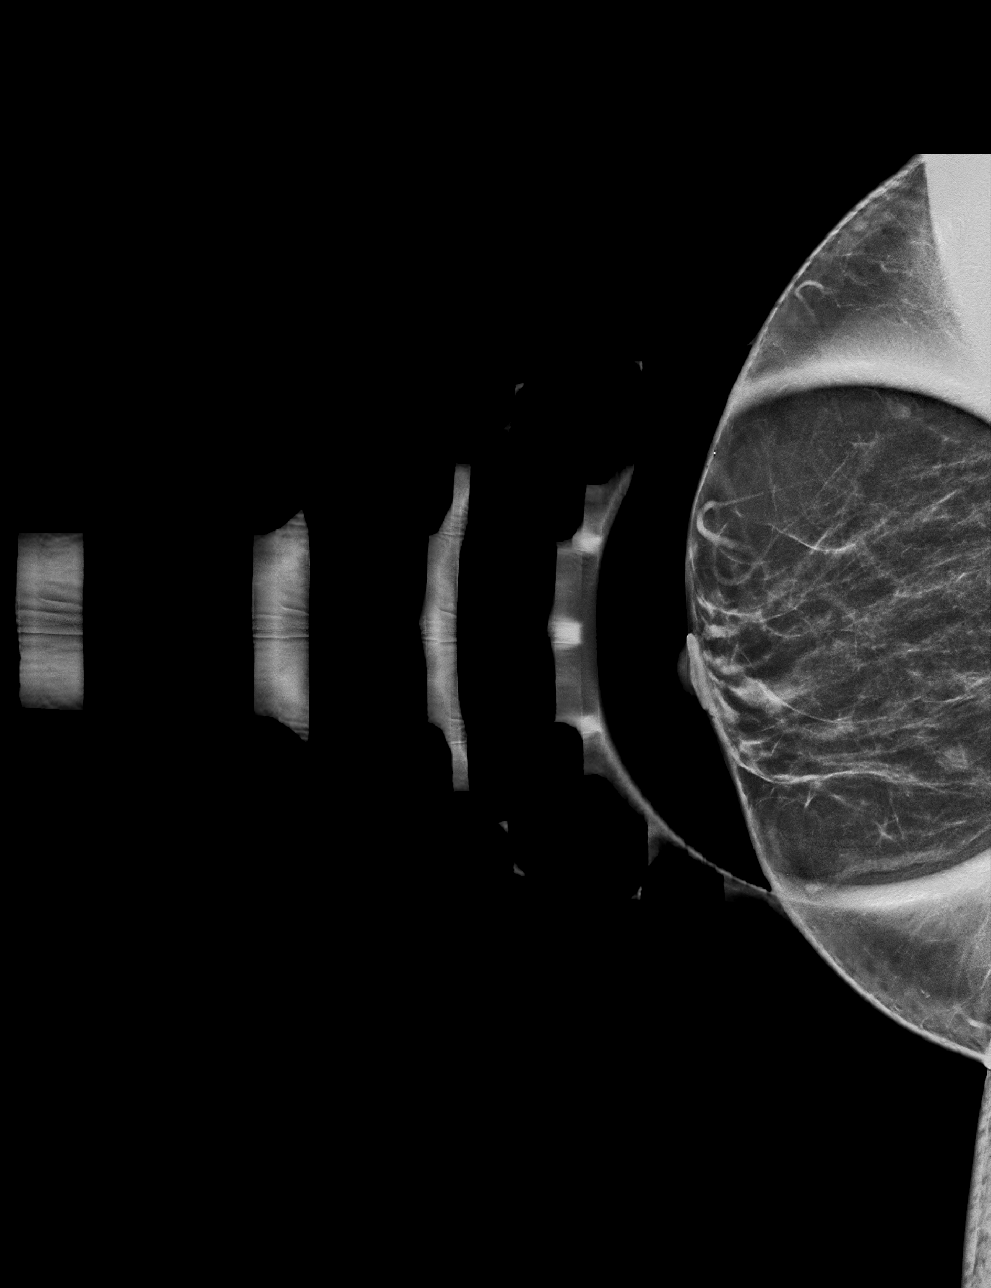

[R MLO tomo · tomo slice 27/52.0]
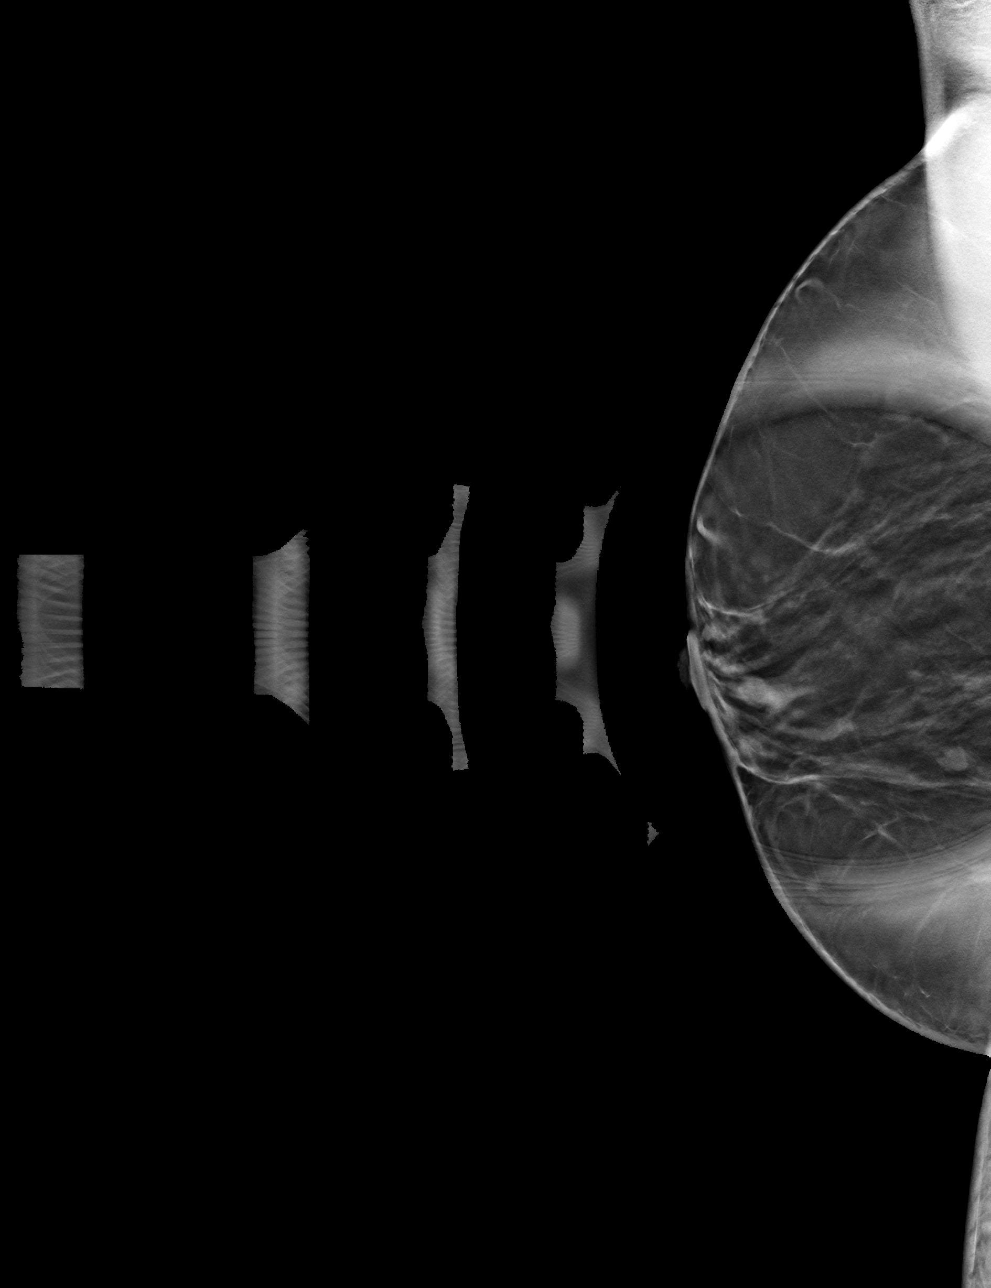

[R CC tomo · tomo slice 25/50.0]
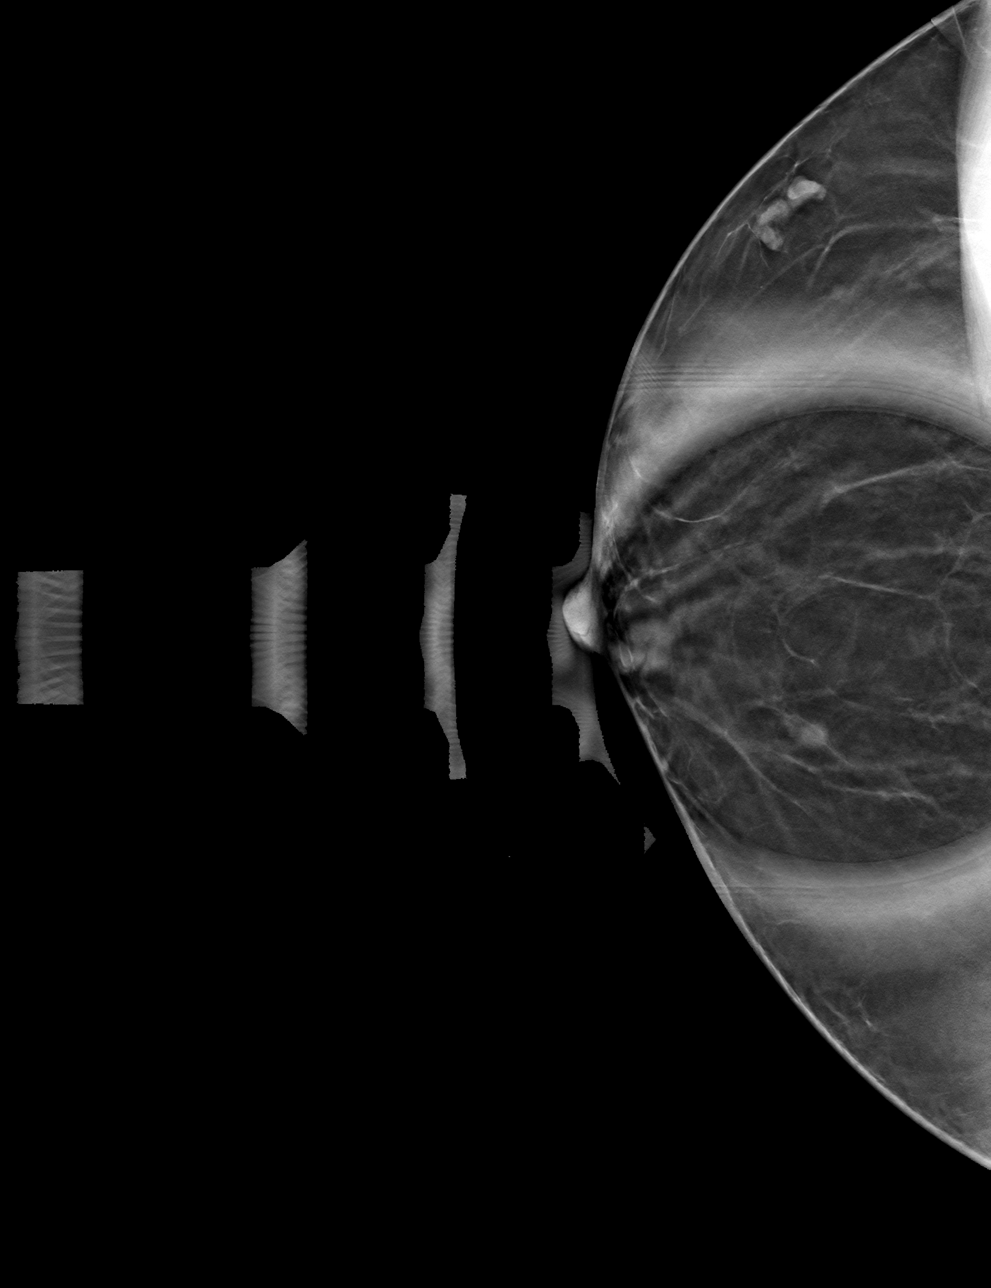

[4 of 12 positions shown; findings below may reference images not displayed]

ACR Breast Density Category b: There are scattered areas of
fibroglandular density.
FINDINGS: There is a persistent circumscribed mass in the subareolar right
breast. A round, circumscribed mass in the central medial right
breast at mid depth is stable from prior mammogram from 0487.
Further evaluation with ultrasound was performed.

Targeted ultrasound is performed, showing a anechoic cyst with
single thin internal septation at the 2 o'clock retroareolar
position. It measures 5 x 3 x 5 mm. This correlates with the medial,
mammographic mass. A focally dilated duct with anechoic internal
fluid is demonstrated at the 1 o'clock retroareolar position. A few
other minimally dilated ducts are scattered in the vicinity. This
correlates with the mammographic finding. No suspicious or
intraductal mass is identified.
IMPRESSION: Benign focally dilated duct in fibrocystic changes in the subareolar
right breast. This correlates well with the screening mammographic
finding. No further follow-up required.

RECOMMENDATION:
Screening mammogram in one year.(Code:PS-U-ATS)

I have discussed the findings and recommendations with the patient.
If applicable, a reminder letter will be sent to the patient
regarding the next appointment.

BI-RADS CATEGORY  2: Benign.

## 2023-05-31 IMAGING — US US BREAST*R* LIMITED INC AXILLA
1 series · 12 of 12 positions shown · non-contrast
Comparison: Previous exam(s).

CLINICAL DATA: 61-year-old female recalled from screening mammogram
dated 10/11/2021 for a possible right breast mass.

EXAM:
DIGITAL DIAGNOSTIC UNILATERAL RIGHT MAMMOGRAM WITH TOMOSYNTHESIS AND
CAD; ULTRASOUND RIGHT BREAST LIMITED
TECHNIQUE: Right digital diagnostic mammography and breast tomosynthesis was
performed. The images were evaluated with computer-aided detection.;
Targeted ultrasound examination of the right breast was performed

[Series 1: us breast*right* limited inc axilla · 0.06mm/px · 12 acquisitions, 12 frames shown]
[im 1/12]
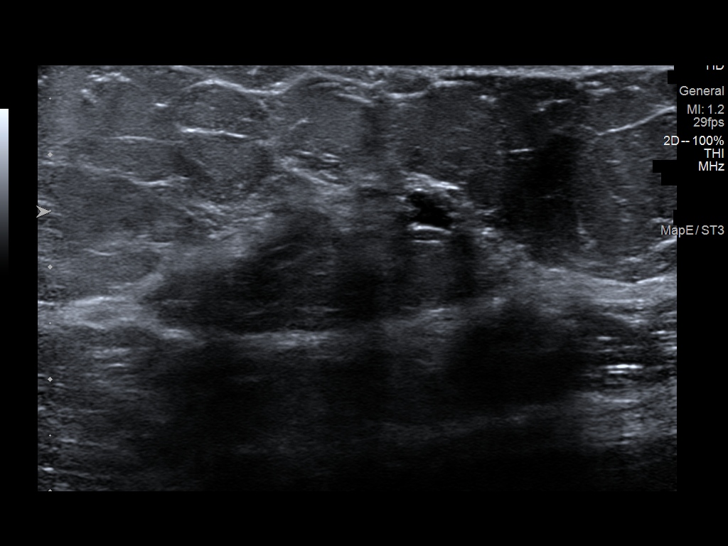
[im 2/12]
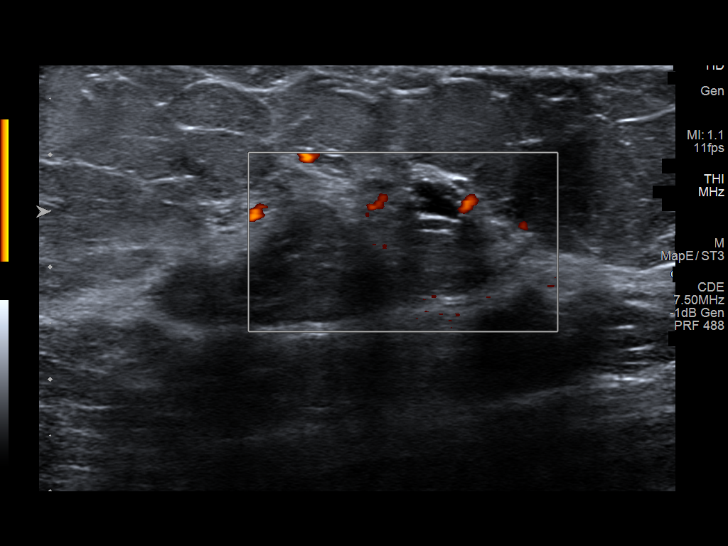
[im 3/12]
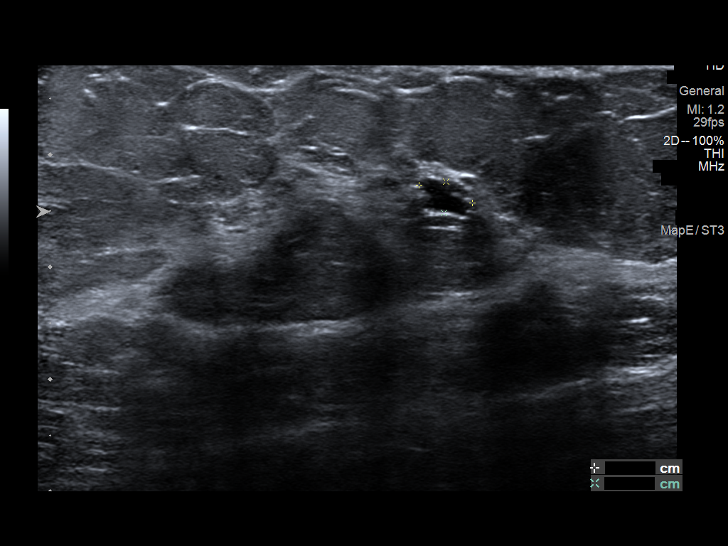
[im 4/12]
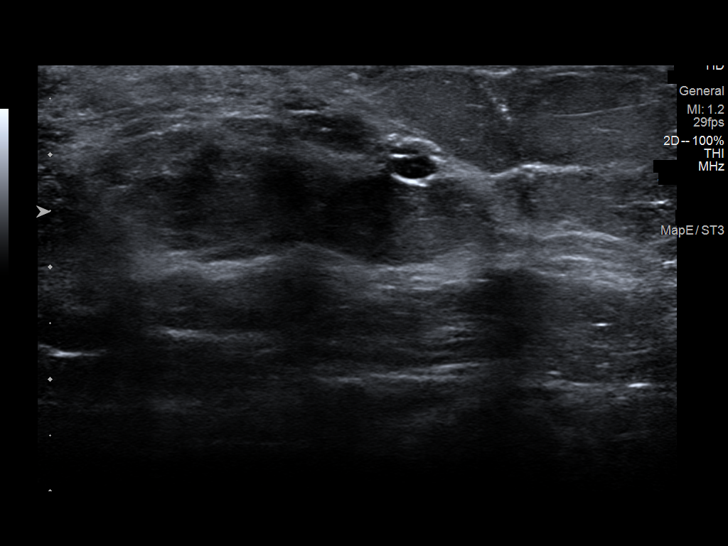
[im 5/12]
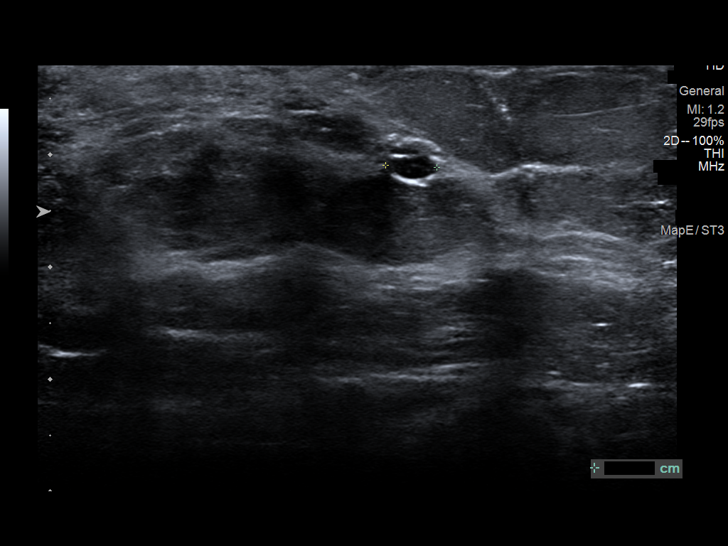
[im 6/12]
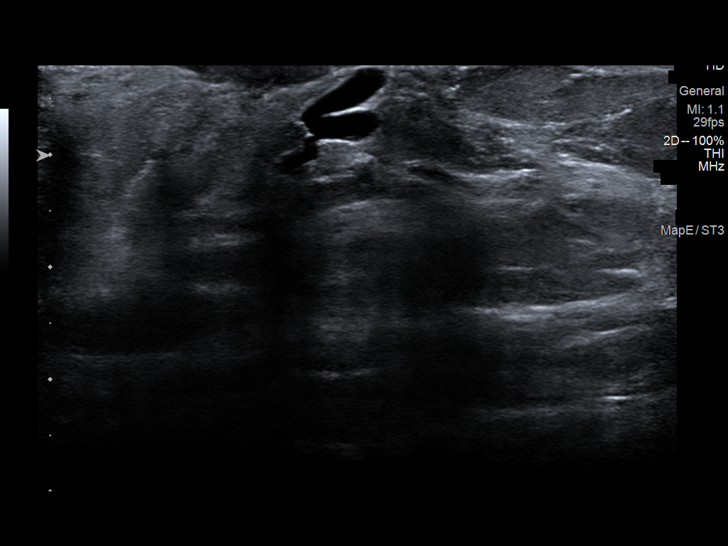
[im 7/12]
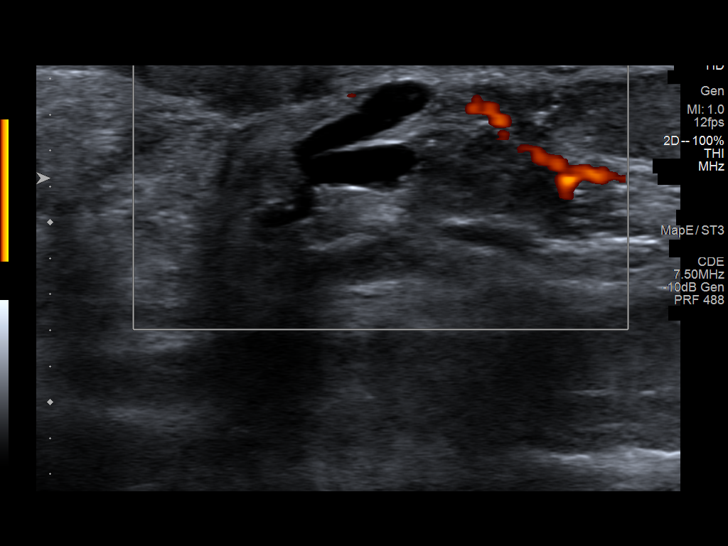
[im 8/12]
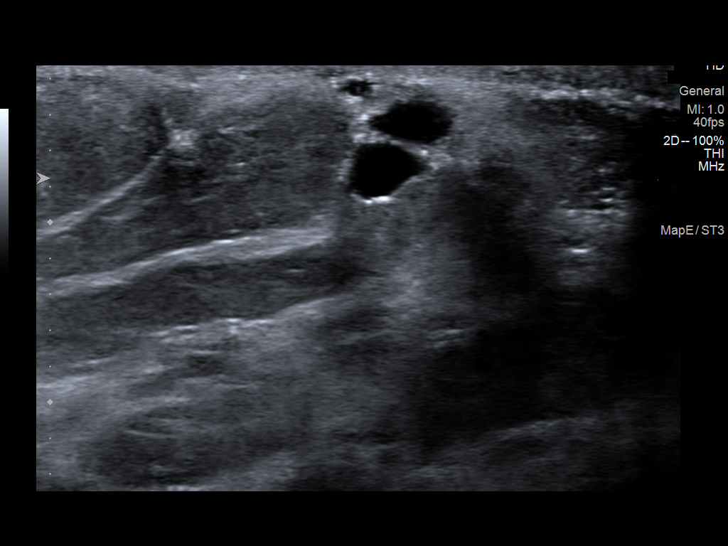
[im 9/12]
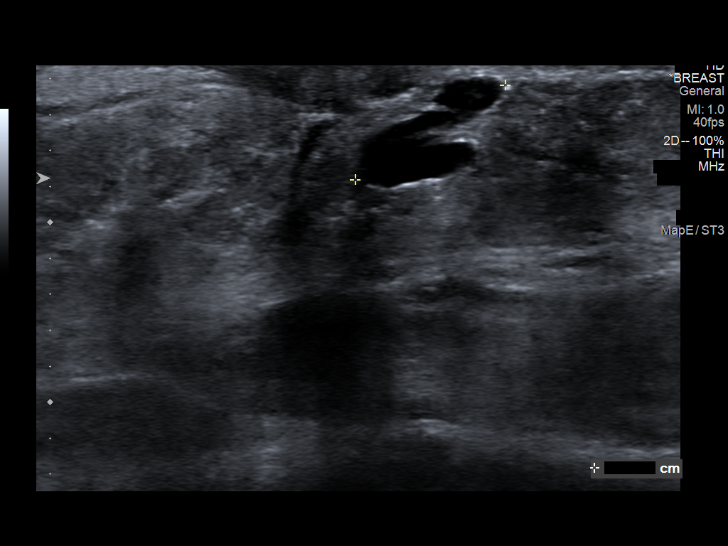
[im 10/12]
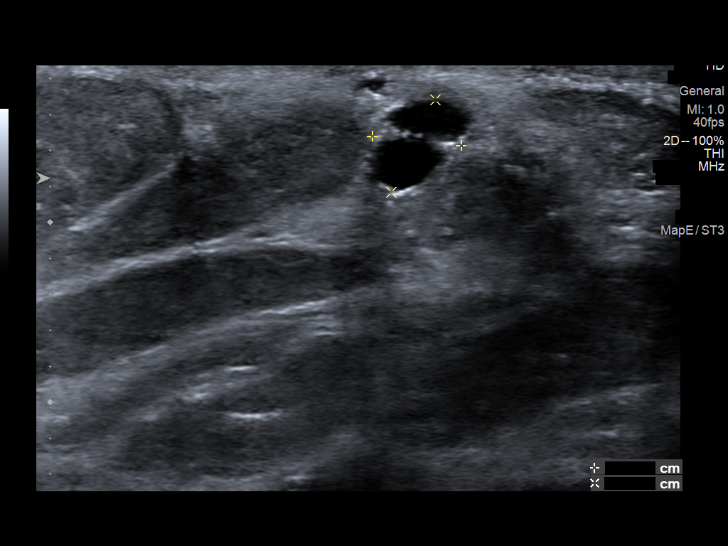
[im 11/12]
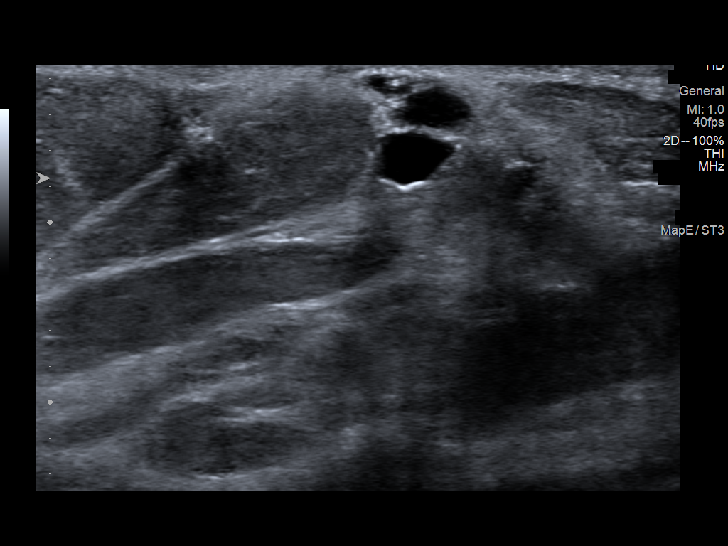
[im 12/12]
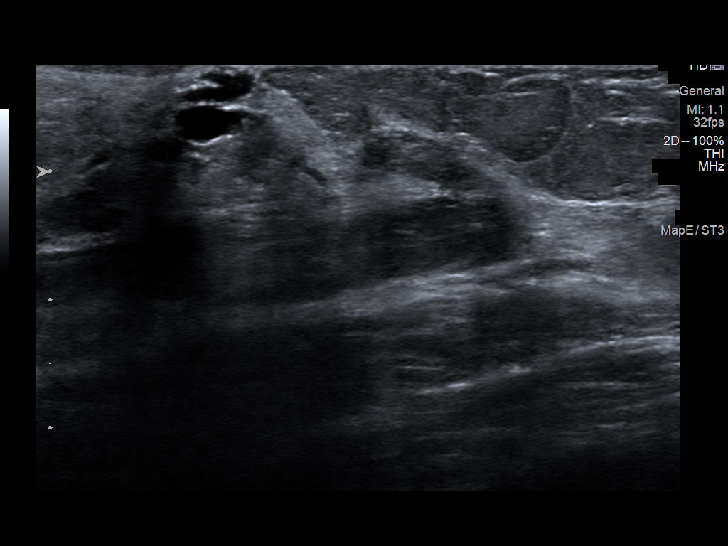

[12 of 12 positions shown; findings below may reference images not displayed]

ACR Breast Density Category b: There are scattered areas of
fibroglandular density.
FINDINGS: There is a persistent circumscribed mass in the subareolar right
breast. A round, circumscribed mass in the central medial right
breast at mid depth is stable from prior mammogram from 0487.
Further evaluation with ultrasound was performed.

Targeted ultrasound is performed, showing a anechoic cyst with
single thin internal septation at the 2 o'clock retroareolar
position. It measures 5 x 3 x 5 mm. This correlates with the medial,
mammographic mass. A focally dilated duct with anechoic internal
fluid is demonstrated at the 1 o'clock retroareolar position. A few
other minimally dilated ducts are scattered in the vicinity. This
correlates with the mammographic finding. No suspicious or
intraductal mass is identified.
IMPRESSION: Benign focally dilated duct in fibrocystic changes in the subareolar
right breast. This correlates well with the screening mammographic
finding. No further follow-up required.

RECOMMENDATION:
Screening mammogram in one year.(Code:PS-U-ATS)

I have discussed the findings and recommendations with the patient.
If applicable, a reminder letter will be sent to the patient
regarding the next appointment.

BI-RADS CATEGORY  2: Benign.

## 2023-06-01 ENCOUNTER — Telehealth: Payer: Self-pay

## 2023-06-01 NOTE — Telephone Encounter (Signed)
[facility] advised that it okay to change to manufacturer on the Levothyroxine per Dr. Lonzo Cloud

## 2023-10-30 ENCOUNTER — Emergency Department (HOSPITAL_BASED_OUTPATIENT_CLINIC_OR_DEPARTMENT_OTHER): Payer: No Typology Code available for payment source | Admitting: Radiology

## 2023-10-30 ENCOUNTER — Encounter (HOSPITAL_BASED_OUTPATIENT_CLINIC_OR_DEPARTMENT_OTHER): Payer: Self-pay

## 2023-10-30 ENCOUNTER — Other Ambulatory Visit: Payer: Self-pay

## 2023-10-30 ENCOUNTER — Emergency Department (HOSPITAL_BASED_OUTPATIENT_CLINIC_OR_DEPARTMENT_OTHER)
Admission: EM | Admit: 2023-10-30 | Discharge: 2023-10-30 | Disposition: A | Discharge status: Home / Self Care | Payer: No Typology Code available for payment source | Attending: Emergency Medicine | Admitting: Emergency Medicine

## 2023-10-30 DIAGNOSIS — R030 Elevated blood-pressure reading, without diagnosis of hypertension: Secondary | ICD-10-CM | POA: Insufficient documentation

## 2023-10-30 DIAGNOSIS — M25512 Pain in left shoulder: Secondary | ICD-10-CM | POA: Insufficient documentation

## 2023-10-30 DIAGNOSIS — Z79899 Other long term (current) drug therapy: Secondary | ICD-10-CM | POA: Insufficient documentation

## 2023-10-30 DIAGNOSIS — M792 Neuralgia and neuritis, unspecified: Secondary | ICD-10-CM | POA: Diagnosis not present

## 2023-10-30 MED ORDER — METHOCARBAMOL 500 MG PO TABS
750.0000 mg | ORAL_TABLET | Freq: Once | ORAL | Status: AC
  Administered 2023-10-30: 750 mg via ORAL
  Filled 2023-10-30: qty 2

## 2023-10-30 MED ORDER — METHOCARBAMOL 750 MG PO TABS: 750.0000 mg | ORAL_TABLET | Freq: Three times a day (TID) | ORAL | 0 refills | Status: AC | PRN

## 2023-10-30 MED ORDER — PREDNISONE 20 MG PO TABS: ORAL_TABLET | ORAL | 0 refills | Status: AC

## 2023-10-30 MED ORDER — PREDNISONE 50 MG PO TABS
60.0000 mg | ORAL_TABLET | Freq: Once | ORAL | Status: AC
  Administered 2023-10-30: 60 mg via ORAL
  Filled 2023-10-30: qty 1

## 2023-10-30 NOTE — Discharge Instructions (Addendum)
 It was our pleasure to provide your ER care today - we hope that you feel better.  Take prednisone  as prescribed. Take acetaminophen  as need for pain. You may also take robaxin  as need for muscle pain/spasm - no driving when taking.   Follow up with primary care doctor in the coming week if symptoms fail to improve/resolve. Also follow up with primary care doctor regarding your blood pressure that is mildly high today.   Return to ER if worse, new symptoms, fevers, arm swelling/redness, arm numbness/weakness, severe/intractable pain, chest pain, trouble breathing, or other concern.

## 2023-10-30 NOTE — ED Triage Notes (Signed)
 Pt POV from home d/t upper left arm pain.  At 1700 yesterday while working on computer she felt a shooting pain.  Nothing is helping - took 2 advil at 0300 and 2 @ 2100 last night.

## 2023-10-30 NOTE — ED Provider Notes (Signed)
 [place] [facility] AT [facility] Provider Note   CSN: [id number] Arrival date & time: 10/30/23  9396     History  Chief Complaint  Patient presents with   Arm Pain    Laurie Peters is a 63 y.o. female.  Pt c/o left shoulder pain that occasional radiates down left arm towards wrist. States occurred while at home, had moves left arm in a position to use keyboard when had sudden sharp pain starting in shoulder area. Pain worse w movement of left shoulder and LUE. No hx same pain. No arm or hand numbness, weakness, or loss of normal function. No neck back. No chest pain or discomfort. No sob or unusual doe. No arm swelling, no skin changes, rash or lesions to area of pain. No fevers.   The history is provided by the patient and medical records.  Arm Pain Pertinent negatives include no chest pain, no abdominal pain, no headaches and no shortness of breath.       Home Medications Prior to Admission medications   Medication Sig Start Date End Date Taking? Authorizing Provider  albuterol  (PROVENTIL ) (2.5 MG/3ML) 0.083% nebulizer solution Take 3 mLs (2.5 mg total) by nebulization every 6 (six) hours as needed for wheezing or shortness of breath. 11/02/19   Mercer Clotilda SAUNDERS, MD  albuterol  (VENTOLIN  HFA) 108 (90 Base) MCG/ACT inhaler Inhale 2 puffs into the lungs every 4 (four) hours as needed for wheezing or shortness of breath. 08/10/20   Mercer Clotilda SAUNDERS, MD  Ascorbic Acid (VITAMIN C) 1000 MG tablet Take 1,000 mg by mouth daily.    [provider]  cetirizine  (ZYRTEC ) 10 MG tablet Take 1 tablet (10 mg total) by mouth daily. 11/21/21   Mercer Clotilda SAUNDERS, MD  chlorthalidone  (HYGROTON ) 25 MG tablet TAKE 1 TABLET BY MOUTH ONCE  DAILY 07/25/22   Mercer Clotilda SAUNDERS, MD  Cholecalciferol (VITAMIN D3) 50 MCG (2000 UT) TABS Take 1 tablet by mouth daily. 10/04/21   Mercer Clotilda SAUNDERS, MD  fluticasone  (FLONASE ) 50 MCG/ACT nasal spray Place 2 sprays into both nostrils daily.  12/06/20   Moishe Chiquita HERO, NP  levothyroxine  (SYNTHROID ) 125 MCG tablet Take 1 tablet (125 mcg total) by mouth daily. 03/18/23   Shamleffer, Ibtehal Jaralla, MD  vitamin B-12 (CYANOCOBALAMIN) 50 MCG tablet Take 50 mcg by mouth daily.    [provider]      Allergies    Theraflu cold & [phenylephrine-pheniramine-dm]    Review of Systems   Review of Systems  Constitutional:  Negative for chills and fever.  Respiratory:  Negative for cough and shortness of breath.   Cardiovascular:  Negative for chest pain.  Gastrointestinal:  Negative for abdominal pain, nausea and vomiting.  Musculoskeletal:  Negative for back pain, neck pain and neck stiffness.  Skin:  Negative for rash.  Neurological:  Negative for weakness, numbness and headaches.    Physical Exam Updated Vital Signs BP (!) 150/81   Pulse 91   Temp (!) 97.2 F (36.2 C) (Oral)   Resp 20   Ht 1.676 m (5' 6)   Wt (!) 156.5 kg   LMP  (LMP Unknown) Comment: Ablation  SpO2 97%   BMI 55.68 kg/m  Physical Exam Vitals and nursing note reviewed.  Constitutional:      Appearance: Normal appearance. She is well-developed.  HENT:     Head: Atraumatic.     Nose: Nose normal.     Mouth/Throat:     Mouth: Mucous membranes  are moist.  Eyes:     General: No scleral icterus.    Conjunctiva/sclera: Conjunctivae normal.  Neck:     Trachea: No tracheal deviation.  Cardiovascular:     Rate and Rhythm: Normal rate and regular rhythm.     Pulses: Normal pulses.     Heart sounds: Normal heart sounds. No murmur heard.    No friction rub. No gallop.  Pulmonary:     Effort: Pulmonary effort is normal. No respiratory distress.     Breath sounds: Normal breath sounds.  Abdominal:     General: There is no distension.     Tenderness: There is no abdominal tenderness.  Musculoskeletal:        General: No swelling.     Cervical back: Normal range of motion and neck supple. No rigidity or tenderness. No muscular tenderness.      Left lower leg: No edema.     Comments: Pain w reproduction sharp shoulder and arm pain w certain movements of left shoulder. No deformity noted. No focal bony tenderness. No LUE swelling. LUE is of normal color and warmth. Radial pulse 2+. Good passive rom left shoulder, elbow, wrist without severe pain. No c spine pain or tenderness. Good rom neck without pain.   Lymphadenopathy:     Cervical: No cervical adenopathy.  Skin:    General: Skin is warm and dry.     Findings: No rash.  Neurological:     Mental Status: She is alert.     Comments: Alert, speech normal. LUE nvi, with motor/strength 5/5, sens rad/med/uln n grossly intact.   Psychiatric:        Mood and Affect: Mood normal.     ED Results / Procedures / Treatments   Labs (all labs ordered are listed, but only abnormal results are displayed) Labs Reviewed - No data to display  EKG None  Radiology DG Shoulder Left Result Date: 10/30/2023 CLINICAL DATA:  Sudden onset pain. EXAM: LEFT SHOULDER - 2+ VIEW COMPARISON:  None Available. FINDINGS: There is no evidence of fracture or dislocation. There is no evidence of arthropathy or other focal bone abnormality. Soft tissues are unremarkable. IMPRESSION: Negative. Electronically Signed   By: Camellia Candle M.D.   On: 10/30/2023 07:29    Procedures Procedures    Medications Ordered in ED Medications  methocarbamol  (ROBAXIN ) tablet 750 mg (0 mg Oral Hold 10/30/23 0746)  predniSONE  (DELTASONE ) tablet 60 mg (60 mg Oral Given 10/30/23 0745)    ED Course/ Medical Decision Making/ A&P                                 Medical Decision Making Problems Addressed: Acute pain of left shoulder: acute illness or injury Elevated blood pressure reading: acute illness or injury Neuralgia: acute illness or injury  Amount and/or Complexity of Data Reviewed Radiology: ordered and independent interpretation performed. Decision-making details documented in ED Course.  Risk Prescription drug  management.   Imaging ordered.   Reviewed nursing notes and prior charts for additional history.   Prednisone  po, robaxin  po.   Xrays reviewed/interpreted by me - no fx.   Suspect msk etiology. Discussed diff dx including rotator cuff strain, tendonitis, bursitis, labral injury, nerve impingement, etc.   Pt appears stable for outpatient f/u.  Return precautions provided.          Final Clinical Impression(s) / ED Diagnoses Final diagnoses:  None    Rx /  DC Orders ED Discharge Orders     None         Bernard Drivers, MD 10/30/23 [phone]

## 2024-03-08 ENCOUNTER — Other Ambulatory Visit: Payer: Self-pay | Admitting: Internal Medicine

## 2024-03-08 DIAGNOSIS — E039 Hypothyroidism, unspecified: Secondary | ICD-10-CM

## 2024-05-02 ENCOUNTER — Other Ambulatory Visit: Payer: Self-pay | Admitting: Internal Medicine

## 2024-05-02 DIAGNOSIS — E039 Hypothyroidism, unspecified: Secondary | ICD-10-CM

## 2024-05-22 ENCOUNTER — Other Ambulatory Visit: Payer: Self-pay | Admitting: Internal Medicine

## 2024-05-22 DIAGNOSIS — E039 Hypothyroidism, unspecified: Secondary | ICD-10-CM

## 2024-07-14 ENCOUNTER — Other Ambulatory Visit: Payer: Self-pay | Admitting: Family Medicine

## 2024-07-14 DIAGNOSIS — Z1231 Encounter for screening mammogram for malignant neoplasm of breast: Secondary | ICD-10-CM

## 2024-07-29 ENCOUNTER — Other Ambulatory Visit

## 2024-07-29 ENCOUNTER — Ambulatory Visit: Admitting: Internal Medicine

## 2024-07-29 ENCOUNTER — Encounter: Payer: Self-pay | Admitting: Internal Medicine

## 2024-07-29 VITALS — BP 162/90 | HR 100 | Ht 66.0 in | Wt 349.0 lb

## 2024-07-29 DIAGNOSIS — E039 Hypothyroidism, unspecified: Secondary | ICD-10-CM

## 2024-07-29 NOTE — Patient Instructions (Signed)

## 2024-07-29 NOTE — Progress Notes (Signed)
 Name: Chantrell Apsey  MRN/ DOB: [id number], Aug 24, 1961    Age/ Sex: 63 y.o., female     PCP: Pridgen, Taylar, NP   Reason for Endocrinology Evaluation: 07/22/2018     Initial Endocrinology Clinic Visit: Hypothyroidism     PATIENT IDENTIFIER: Laurie Peters is a 63 y.o., female with a past medical history of menorrhagia and anemia secondary to fibroids, HTN, and Asthma  . She has followed with [facility] since 07/22/2018 for consultative assistance with management of her hypothyroidism    HISTORICAL SUMMARY: The patient was first diagnosed with abnormal thyroid  function tests in 2017, she was c/o fatigue at the time. This was not investigated again until 05/2018.   She was started on LT-4 replacement in 06/2018  Mother with hypothyroidism  SUBJECTIVE:   Today (07/29/2024):  Laurie Peters is here for a follow up on hypothyroidism.    No local neck swelling  No constipation  No depression  No palpitations  No tremors  No Biotin    Levothyroxine  125 mcg daily   HISTORY:  Past Medical History:  Past Medical History:  Diagnosis Date   Asthma    Heart murmur    History of blood transfusion    after birth per patient   Hypertension    SVD (spontaneous vaginal delivery)    x 4   Termination of pregnancy (fetus)    x 2   Uterine fibroids affecting pregnancy, antepartum    Past Surgical History:  Past Surgical History:  Procedure Laterality Date   CHOLECYSTECTOMY N/A 06/15/2016   Procedure: LAPAROSCOPIC CHOLECYSTECTOMY;  Surgeon: Vicenta Poli, MD;  Location: [facility] OR;  Service: General;  Laterality: N/A;   DILITATION & CURRETTAGE/HYSTROSCOPY WITH HYDROTHERMAL ABLATION N/A 07/08/2018   Procedure: DILATATION & CURETTAGE/HYSTEROSCOPY WITH HYDROTHERMAL ABLATION;  Surgeon: Henry Slough, MD;  Location: [facility] ORS;  Service: Gynecology;  Laterality: N/A;   WISDOM TOOTH EXTRACTION     only 2 removed   Social History:  reports that she has never smoked.  She has never used smokeless tobacco. She reports current alcohol use. She reports that she does not use drugs. Family History:  Family History  Problem Relation Age of Onset   Hypertension Mother    Hypertension Father    Heart disease Father    Stroke Father    Asthma Sister    Asthma Son    Hyperlipidemia Maternal Grandmother    Heart disease Maternal Grandmother    Stroke Maternal Grandmother    Hyperlipidemia Paternal Grandmother    Heart disease Paternal Grandmother    Cancer Paternal Grandmother    Stroke Paternal Grandfather    Cancer Sister        uterus   Heart disease Brother    Heart disease Brother    Diabetes Brother    Heart disease Brother      HOME MEDICATIONS: Allergies as of 07/29/2024       Reactions   Theraflu Cold & [phenylephrine-pheniramine-dm] Hives        Medication List        Accurate as of July 29, 2024  7:53 AM. If you have any questions, ask your nurse or doctor.          albuterol  108 (90 Base) MCG/ACT inhaler Commonly known as: VENTOLIN  HFA Inhale 2 puffs into the lungs every 4 (four) hours as needed for wheezing or shortness of breath. What changed: Another medication with the same name was removed. Continue taking this medication, and  follow the directions you see here. Changed by: Donell PARAS Reana Chacko   cetirizine  10 MG tablet Commonly known as: ZYRTEC  Take 1 tablet (10 mg total) by mouth daily.   chlorthalidone  25 MG tablet Commonly known as: HYGROTON  TAKE 1 TABLET BY MOUTH ONCE  DAILY   fluticasone  50 MCG/ACT nasal spray Commonly known as: FLONASE  Place 2 sprays into both nostrils daily.   levothyroxine  125 MCG tablet Commonly known as: SYNTHROID  TAKE 1 TABLET BY MOUTH DAILY   methocarbamol  750 MG tablet Commonly known as: ROBAXIN  Take 1 tablet (750 mg total) by mouth 3 (three) times daily as needed (muscle spasm/pain).   predniSONE  20 MG tablet Commonly known as: DELTASONE  3 po once a day for 2 days,  then 2 po once a day for 2 days, then 1 po once a day for 2 days   vitamin B-12 50 MCG tablet Commonly known as: CYANOCOBALAMIN Take 50 mcg by mouth daily.   vitamin C 1000 MG tablet Take 1,000 mg by mouth daily.   Vitamin D3 50 MCG (2000 UT) Tabs Take 1 tablet by mouth daily.          OBJECTIVE:   PHYSICAL EXAM: VS: BP (!) 162/90   Pulse 100   Ht 5' 6 (1.676 m)   Wt (!) 349 lb (158.3 kg)   LMP  (LMP Unknown) Comment: Ablation  SpO2 99%   BMI 56.33 kg/m    EXAM: General: Pt appears well and is in NAD  Neck: General: Supple without adenopathy. Thyroid : Thyroid  size normal.  No goiter or nodules appreciated  Lungs: Clear with good BS bilat with no rales, rhonchi, or wheezes  Heart: Auscultation: RRR.  Extremities:  BL LE: Trace pretibial edema normal ROM and strength.  Mental Status: Judgment, insight: Intact Orientation: Oriented to time, place, and person Mood and affect: No depression, anxiety, or agitation     DATA REVIEWED:   Latest Reference Range & Units 07/29/24 08:16  TSH 0.40 - 4.50 mIU/L 3.42  T4,Free(Direct) 0.8 - 1.8 ng/dL 1.5      Labs through [facility] 01/11/2024  TSH 8.180 ASSESSMENT / PLAN / RECOMMENDATIONS:   1. Hypothyroidism:   - Patient is clinically euthyroid - No local neck symptoms - She did have elevated TSH at PCPs office, but the patient was traveling at the time - Repeat TFTs are within normal range, no change  Medications  Continue  levothyroxine  125 MCG daily    Follow-up in 1 year   Signed electronically by: Stefano Redgie Butts, MD  [facility]  [facility] [address] [place] [address] [place], [place] [address] Phone: [phone] FAX: [phone]      CC: Domenick Loma, NP [address] [place] [address] Phone: [phone]  Fax: [phone]   Return to Endocrinology clinic as below: Future Appointments  Date Time Provider Department Center  08/04/2024  7:50  AM [facility] 2 [facility]

## 2024-07-30 LAB — TSH: TSH: 3.42 m[IU]/L (ref 0.40–4.50)

## 2024-07-30 LAB — T4, FREE: Free T4: 1.5 ng/dL (ref 0.8–1.8)

## 2024-08-01 ENCOUNTER — Ambulatory Visit: Payer: Self-pay | Admitting: Internal Medicine

## 2024-08-01 ENCOUNTER — Other Ambulatory Visit: Payer: Self-pay | Admitting: Internal Medicine

## 2024-08-01 DIAGNOSIS — E039 Hypothyroidism, unspecified: Secondary | ICD-10-CM

## 2024-08-01 MED ORDER — LEVOTHYROXINE SODIUM 125 MCG PO TABS: 125.0000 ug | ORAL_TABLET | Freq: Every day | ORAL | 3 refills | Status: DC

## 2024-08-04 ENCOUNTER — Ambulatory Visit

## 2024-09-07 LAB — COLOGUARD: COLOGUARD: NEGATIVE

## 2024-10-05 ENCOUNTER — Other Ambulatory Visit: Payer: Self-pay | Admitting: Internal Medicine

## 2024-10-05 DIAGNOSIS — E039 Hypothyroidism, unspecified: Secondary | ICD-10-CM

## 2025-01-27 ENCOUNTER — Ambulatory Visit: Admitting: Internal Medicine
